# Patient Record
Sex: Male | Born: 1945 | Race: Black or African American | Hispanic: No | Marital: Married | Smoking: Former smoker
Health system: Southern US, Community
[De-identification: ages and names within clinical notes are randomized; demographics above are authoritative.]

## PROBLEM LIST (undated history)

## (undated) DIAGNOSIS — D649 Anemia, unspecified: Secondary | ICD-10-CM

## (undated) DIAGNOSIS — D509 Iron deficiency anemia, unspecified: Secondary | ICD-10-CM

## (undated) DIAGNOSIS — E1129 Type 2 diabetes mellitus with other diabetic kidney complication: Secondary | ICD-10-CM

## (undated) DIAGNOSIS — C801 Malignant (primary) neoplasm, unspecified: Secondary | ICD-10-CM

## (undated) DIAGNOSIS — M869 Osteomyelitis, unspecified: Secondary | ICD-10-CM

## (undated) DIAGNOSIS — R0602 Shortness of breath: Secondary | ICD-10-CM

## (undated) DIAGNOSIS — K759 Inflammatory liver disease, unspecified: Secondary | ICD-10-CM

## (undated) DIAGNOSIS — I739 Peripheral vascular disease, unspecified: Secondary | ICD-10-CM

## (undated) DIAGNOSIS — Z992 Dependence on renal dialysis: Secondary | ICD-10-CM

## (undated) DIAGNOSIS — B20 Human immunodeficiency virus [HIV] disease: Secondary | ICD-10-CM

## (undated) DIAGNOSIS — G709 Myoneural disorder, unspecified: Secondary | ICD-10-CM

## (undated) DIAGNOSIS — I10 Essential (primary) hypertension: Secondary | ICD-10-CM

## (undated) DIAGNOSIS — N186 End stage renal disease: Secondary | ICD-10-CM

## (undated) HISTORY — PX: HERNIA REPAIR: SHX51

## (undated) HISTORY — PX: HEMORROIDECTOMY: SUR656

## (undated) HISTORY — DX: Peripheral vascular disease, unspecified: I73.9

## (undated) HISTORY — PX: INSERTION OF DIALYSIS CATHETER: SHX1324

---

## 2006-01-28 ENCOUNTER — Emergency Department (HOSPITAL_COMMUNITY): Admission: EM | Admit: 2006-01-28 | Discharge: 2006-01-28 | Payer: Self-pay | Admitting: Emergency Medicine

## 2006-01-31 ENCOUNTER — Emergency Department (HOSPITAL_COMMUNITY): Admission: EM | Admit: 2006-01-31 | Discharge: 2006-01-31 | Payer: Self-pay | Admitting: Family Medicine

## 2006-12-29 ENCOUNTER — Emergency Department (HOSPITAL_COMMUNITY): Admission: EM | Admit: 2006-12-29 | Discharge: 2006-12-29 | Payer: Self-pay | Admitting: Emergency Medicine

## 2009-06-23 ENCOUNTER — Emergency Department (HOSPITAL_COMMUNITY): Admission: EM | Admit: 2009-06-23 | Discharge: 2009-06-23 | Payer: Self-pay | Admitting: Emergency Medicine

## 2009-08-20 ENCOUNTER — Emergency Department (HOSPITAL_COMMUNITY): Admission: EM | Admit: 2009-08-20 | Discharge: 2009-08-20 | Payer: Self-pay | Admitting: Emergency Medicine

## 2010-02-14 ENCOUNTER — Emergency Department (HOSPITAL_COMMUNITY): Admission: EM | Admit: 2010-02-14 | Discharge: 2010-02-14 | Payer: Self-pay | Admitting: Family Medicine

## 2010-02-14 ENCOUNTER — Inpatient Hospital Stay (HOSPITAL_COMMUNITY): Admission: EM | Admit: 2010-02-14 | Discharge: 2010-02-16 | Payer: Self-pay | Admitting: Emergency Medicine

## 2011-01-16 LAB — URINALYSIS, ROUTINE W REFLEX MICROSCOPIC
Bilirubin Urine: NEGATIVE
Glucose, UA: NEGATIVE mg/dL
Ketones, ur: NEGATIVE mg/dL

## 2011-01-16 LAB — POCT I-STAT, CHEM 8
Calcium, Ion: 1.2 mmol/L (ref 1.12–1.32)
Creatinine, Ser: 2.5 mg/dL — ABNORMAL HIGH (ref 0.4–1.5)
Glucose, Bld: 227 mg/dL — ABNORMAL HIGH (ref 70–99)
HCT: 34 % — ABNORMAL LOW (ref 39.0–52.0)
Hemoglobin: 11.6 g/dL — ABNORMAL LOW (ref 13.0–17.0)

## 2011-01-16 LAB — CBC
HCT: 29.8 % — ABNORMAL LOW (ref 39.0–52.0)
Hemoglobin: 10.3 g/dL — ABNORMAL LOW (ref 13.0–17.0)
MCV: 91.7 fL (ref 78.0–100.0)
MCV: 93.2 fL (ref 78.0–100.0)
Platelets: 109 10*3/uL — ABNORMAL LOW (ref 150–400)
Platelets: 115 10*3/uL — ABNORMAL LOW (ref 150–400)
RBC: 3.17 MIL/uL — ABNORMAL LOW (ref 4.22–5.81)
RDW: 14.8 % (ref 11.5–15.5)
WBC: 4.8 10*3/uL (ref 4.0–10.5)

## 2011-01-16 LAB — BASIC METABOLIC PANEL
BUN: 20 mg/dL (ref 6–23)
BUN: 25 mg/dL — ABNORMAL HIGH (ref 6–23)
CO2: 16 mEq/L — ABNORMAL LOW (ref 19–32)
Creatinine, Ser: 1.76 mg/dL — ABNORMAL HIGH (ref 0.4–1.5)
GFR calc non Af Amer: 34 mL/min — ABNORMAL LOW (ref >60)
GFR calc non Af Amer: 39 mL/min — ABNORMAL LOW (ref >60)
Glucose, Bld: 137 mg/dL — ABNORMAL HIGH (ref 70–99)
Potassium: 4.3 mEq/L (ref 3.5–5.1)

## 2011-01-16 LAB — GLUCOSE, CAPILLARY
Glucose-Capillary: 124 mg/dL — ABNORMAL HIGH (ref 70–99)
Glucose-Capillary: 128 mg/dL — ABNORMAL HIGH (ref 70–99)
Glucose-Capillary: 134 mg/dL — ABNORMAL HIGH (ref 70–99)
Glucose-Capillary: 140 mg/dL — ABNORMAL HIGH (ref 70–99)
Glucose-Capillary: 226 mg/dL — ABNORMAL HIGH (ref 70–99)
Glucose-Capillary: 228 mg/dL — ABNORMAL HIGH (ref 70–99)
Glucose-Capillary: 92 mg/dL (ref 70–99)

## 2011-01-16 LAB — MAGNESIUM: Magnesium: 2.3 mg/dL (ref 1.5–2.5)

## 2011-01-16 LAB — POCT CARDIAC MARKERS: Troponin i, poc: <0.05 ng/mL (ref 0.00–0.09)

## 2011-01-16 LAB — HEMOGLOBIN A1C: Hgb A1c MFr Bld: 6.8 % — ABNORMAL HIGH (ref <5.7)

## 2011-01-16 LAB — URINE MICROSCOPIC-ADD ON

## 2011-01-16 LAB — URINE CULTURE

## 2011-01-16 LAB — LIPID PANEL: Cholesterol: 170 mg/dL (ref 0–200)

## 2011-12-26 ENCOUNTER — Encounter (HOSPITAL_COMMUNITY)
Admission: RE | Admit: 2011-12-26 | Discharge: 2011-12-26 | Disposition: A | Discharge status: Home / Self Care | Payer: Non-veteran care | Source: Ambulatory Visit | Attending: Nephrology | Admitting: Nephrology

## 2011-12-26 DIAGNOSIS — D649 Anemia, unspecified: Secondary | ICD-10-CM | POA: Insufficient documentation

## 2011-12-26 LAB — ABO/RH: ABO/RH(D): O POS

## 2011-12-27 LAB — TYPE AND SCREEN
Antibody Screen: NEGATIVE
Unit division: 0

## 2012-01-03 ENCOUNTER — Other Ambulatory Visit (HOSPITAL_COMMUNITY): Payer: Self-pay | Admitting: Nephrology

## 2012-01-03 DIAGNOSIS — N186 End stage renal disease: Secondary | ICD-10-CM

## 2012-01-04 ENCOUNTER — Ambulatory Visit (HOSPITAL_COMMUNITY)
Admission: RE | Admit: 2012-01-04 | Discharge: 2012-01-04 | Disposition: A | Discharge status: Home / Self Care | Payer: Non-veteran care | Source: Ambulatory Visit | Attending: Nephrology | Admitting: Nephrology

## 2012-01-04 DIAGNOSIS — Z452 Encounter for adjustment and management of vascular access device: Secondary | ICD-10-CM | POA: Insufficient documentation

## 2012-01-04 DIAGNOSIS — N186 End stage renal disease: Secondary | ICD-10-CM | POA: Insufficient documentation

## 2012-01-04 MED ORDER — CHLORHEXIDINE GLUCONATE 4 % EX LIQD
CUTANEOUS | Status: AC
  Filled 2012-01-04: qty 30

## 2012-01-07 ENCOUNTER — Telehealth (HOSPITAL_COMMUNITY): Payer: Self-pay | Admitting: Radiology

## 2012-09-02 ENCOUNTER — Observation Stay (HOSPITAL_COMMUNITY): Payer: Medicare PPO

## 2012-09-02 ENCOUNTER — Encounter: Payer: Self-pay | Admitting: Internal Medicine

## 2012-09-02 ENCOUNTER — Inpatient Hospital Stay (HOSPITAL_COMMUNITY)
Admission: AD | Admit: 2012-09-02 | Discharge: 2012-09-05 | DRG: 977 | Disposition: A | Discharge status: Home Health | Payer: Medicare PPO | Source: Ambulatory Visit | Attending: Internal Medicine | Admitting: Internal Medicine

## 2012-09-02 DIAGNOSIS — F17200 Nicotine dependence, unspecified, uncomplicated: Secondary | ICD-10-CM | POA: Diagnosis present

## 2012-09-02 DIAGNOSIS — N058 Unspecified nephritic syndrome with other morphologic changes: Secondary | ICD-10-CM | POA: Diagnosis present

## 2012-09-02 DIAGNOSIS — B20 Human immunodeficiency virus [HIV] disease: Principal | ICD-10-CM | POA: Diagnosis present

## 2012-09-02 DIAGNOSIS — E1129 Type 2 diabetes mellitus with other diabetic kidney complication: Secondary | ICD-10-CM | POA: Diagnosis present

## 2012-09-02 DIAGNOSIS — Z885 Allergy status to narcotic agent status: Secondary | ICD-10-CM

## 2012-09-02 DIAGNOSIS — I10 Essential (primary) hypertension: Secondary | ICD-10-CM | POA: Diagnosis present

## 2012-09-02 DIAGNOSIS — D649 Anemia, unspecified: Secondary | ICD-10-CM

## 2012-09-02 DIAGNOSIS — Z9221 Personal history of antineoplastic chemotherapy: Secondary | ICD-10-CM

## 2012-09-02 DIAGNOSIS — Z9181 History of falling: Secondary | ICD-10-CM

## 2012-09-02 DIAGNOSIS — Z8619 Personal history of other infectious and parasitic diseases: Secondary | ICD-10-CM

## 2012-09-02 DIAGNOSIS — C801 Malignant (primary) neoplasm, unspecified: Secondary | ICD-10-CM | POA: Insufficient documentation

## 2012-09-02 DIAGNOSIS — N186 End stage renal disease: Secondary | ICD-10-CM | POA: Diagnosis present

## 2012-09-02 DIAGNOSIS — M5430 Sciatica, unspecified side: Secondary | ICD-10-CM | POA: Diagnosis present

## 2012-09-02 DIAGNOSIS — Z8249 Family history of ischemic heart disease and other diseases of the circulatory system: Secondary | ICD-10-CM

## 2012-09-02 DIAGNOSIS — K759 Inflammatory liver disease, unspecified: Secondary | ICD-10-CM | POA: Insufficient documentation

## 2012-09-02 DIAGNOSIS — I120 Hypertensive chronic kidney disease with stage 5 chronic kidney disease or end stage renal disease: Secondary | ICD-10-CM | POA: Diagnosis present

## 2012-09-02 DIAGNOSIS — D509 Iron deficiency anemia, unspecified: Secondary | ICD-10-CM | POA: Diagnosis present

## 2012-09-02 DIAGNOSIS — D631 Anemia in chronic kidney disease: Secondary | ICD-10-CM | POA: Diagnosis present

## 2012-09-02 DIAGNOSIS — Z85048 Personal history of other malignant neoplasm of rectum, rectosigmoid junction, and anus: Secondary | ICD-10-CM

## 2012-09-02 DIAGNOSIS — Z992 Dependence on renal dialysis: Secondary | ICD-10-CM

## 2012-09-02 HISTORY — DX: Anemia, unspecified: D64.9

## 2012-09-02 HISTORY — DX: Type 2 diabetes mellitus with other diabetic kidney complication: E11.29

## 2012-09-02 HISTORY — DX: Human immunodeficiency virus (HIV) disease: B20

## 2012-09-02 HISTORY — DX: Malignant (primary) neoplasm, unspecified: C80.1

## 2012-09-02 HISTORY — DX: Dependence on renal dialysis: Z99.2

## 2012-09-02 HISTORY — DX: Inflammatory liver disease, unspecified: K75.9

## 2012-09-02 HISTORY — DX: End stage renal disease: N18.6

## 2012-09-02 HISTORY — DX: Shortness of breath: R06.02

## 2012-09-02 HISTORY — DX: Iron deficiency anemia, unspecified: D50.9

## 2012-09-02 HISTORY — DX: Myoneural disorder, unspecified: G70.9

## 2012-09-02 HISTORY — DX: Essential (primary) hypertension: I10

## 2012-09-02 LAB — CBC
HCT: 13.6 % — ABNORMAL LOW (ref 39.0–52.0)
Platelets: 133 10*3/uL — ABNORMAL LOW (ref 150–400)
RDW: 15.4 % (ref 11.5–15.5)
WBC: 5 10*3/uL (ref 4.0–10.5)

## 2012-09-02 LAB — GLUCOSE, CAPILLARY
Glucose-Capillary: 249 mg/dL — ABNORMAL HIGH (ref 70–99)
Glucose-Capillary: 367 mg/dL — ABNORMAL HIGH (ref 70–99)

## 2012-09-02 LAB — BASIC METABOLIC PANEL
GFR calc Af Amer: 4 mL/min — ABNORMAL LOW (ref >90)
GFR calc non Af Amer: 4 mL/min — ABNORMAL LOW (ref >90)
Potassium: 5.1 mEq/L (ref 3.5–5.1)
Sodium: 134 mEq/L — ABNORMAL LOW (ref 135–145)

## 2012-09-02 LAB — RETICULOCYTES: Retic Count, Absolute: 61.7 10*3/uL (ref 19.0–186.0)

## 2012-09-02 MED ORDER — ONDANSETRON HCL 4 MG/2ML IJ SOLN: 4.0000 mg | Freq: Four times a day (QID) | INTRAMUSCULAR | Status: DC | PRN

## 2012-09-02 MED ORDER — DELFLEX-LC/1.5% DEXTROSE 346 MOSM/L IP SOLN
INTRAPERITONEAL | Status: DC
  Administered 2012-09-02: 5 L via INTRAPERITONEAL
  Administered 2012-09-04: 5000 mL via INTRAPERITONEAL

## 2012-09-02 MED ORDER — SODIUM CHLORIDE 0.9 % IV SOLN
250.0000 mL | INTRAVENOUS | Status: DC | PRN
Start: 2012-09-02 — End: 2012-09-05

## 2012-09-02 MED ORDER — ONDANSETRON HCL 4 MG PO TABS: 4.0000 mg | ORAL_TABLET | Freq: Four times a day (QID) | ORAL | Status: DC | PRN

## 2012-09-02 MED ORDER — ACETAMINOPHEN 325 MG PO TABS
650.0000 mg | ORAL_TABLET | Freq: Four times a day (QID) | ORAL | Status: DC | PRN
  Administered 2012-09-03 (×2): 650 mg via ORAL
  Filled 2012-09-02 (×3): qty 2

## 2012-09-02 MED ORDER — INSULIN ASPART 100 UNIT/ML ~~LOC~~ SOLN
0.0000 [IU] | Freq: Every day | SUBCUTANEOUS | Status: DC
  Administered 2012-09-02 – 2012-09-04 (×3): 2 [IU] via SUBCUTANEOUS

## 2012-09-02 MED ORDER — SODIUM CHLORIDE 0.9 % IJ SOLN: 3.0000 mL | INTRAMUSCULAR | Status: DC | PRN

## 2012-09-02 MED ORDER — ENOXAPARIN SODIUM 30 MG/0.3ML ~~LOC~~ SOLN
30.0000 mg | Freq: Every day | SUBCUTANEOUS | Status: DC
  Administered 2012-09-02 – 2012-09-04 (×3): 30 mg via SUBCUTANEOUS
  Filled 2012-09-02 (×4): qty 0.3

## 2012-09-02 MED ORDER — SODIUM CHLORIDE 0.9 % IJ SOLN
3.0000 mL | Freq: Two times a day (BID) | INTRAMUSCULAR | Status: DC
  Administered 2012-09-02 – 2012-09-04 (×5): 3 mL via INTRAVENOUS

## 2012-09-02 MED ORDER — BISACODYL 10 MG RE SUPP: 10.0000 mg | Freq: Every day | RECTAL | Status: DC | PRN

## 2012-09-02 MED ORDER — ACETAMINOPHEN 650 MG RE SUPP: 650.0000 mg | Freq: Four times a day (QID) | RECTAL | Status: DC | PRN

## 2012-09-02 MED ORDER — INSULIN ASPART 100 UNIT/ML ~~LOC~~ SOLN
0.0000 [IU] | Freq: Three times a day (TID) | SUBCUTANEOUS | Status: DC
  Administered 2012-09-02: 15 [IU] via SUBCUTANEOUS
  Administered 2012-09-03: 3 [IU] via SUBCUTANEOUS
  Administered 2012-09-03: 8 [IU] via SUBCUTANEOUS
  Administered 2012-09-04 (×2): 5 [IU] via SUBCUTANEOUS
  Administered 2012-09-04 – 2012-09-05 (×3): 3 [IU] via SUBCUTANEOUS
  Administered 2012-09-05: 8 [IU] via SUBCUTANEOUS

## 2012-09-02 MED ORDER — PANTOPRAZOLE SODIUM 40 MG PO TBEC
40.0000 mg | DELAYED_RELEASE_TABLET | Freq: Every day | ORAL | Status: DC
  Administered 2012-09-02 – 2012-09-04 (×3): 40 mg via ORAL
  Filled 2012-09-02 (×3): qty 1

## 2012-09-02 MED ORDER — SODIUM CHLORIDE 0.9 % IJ SOLN: 3.0000 mL | Freq: Two times a day (BID) | INTRAMUSCULAR | Status: DC

## 2012-09-02 MED ORDER — DELFLEX-LC/2.5% DEXTROSE 394 MOSM/L IP SOLN
INTRAPERITONEAL | Status: DC
  Administered 2012-09-04: 5000 mL via INTRAPERITONEAL

## 2012-09-02 NOTE — Consult Note (Signed)
Referring Provider: No ref. provider found Primary Care Physician:  Evlyn Courier, MD Primary Nephrologist:  [facility]  Reason for Consultation: medical management of ESRD   HPI:66 year old male with a history of disease on peritoneal dialysis, diabetes mellitus type 2 and HIV. He was presented yesterday to Dr. Adaline Sill office with fatigue, dizziness and loss of energy. Patient was evaluated by blood work which was reported today. His hemoglobin is 4.8 and his hematocrit is 15   Past Medical History  Diagnosis Date  . ESRD on peritoneal dialysis   . HIV disease   . Hypertension   . Shortness of breath   . DM type 2 causing renal disease   . Neuromuscular disorder     tremers  . Cancer     hx of rectal cancer  . Anemia 09/02/2012  . Hepatitis     hx of hepatitis B    Past Surgical History  Procedure Date  . Hemorroidectomy   . Insertion of dialysis catheter   . Hernia repair     TESTICLE    Prior to Admission medications   Not on File    Current Facility-Administered Medications  Medication Dose Route Frequency Provider Last Rate Last Dose  . 0.9 %  sodium chloride infusion  250 mL Intravenous PRN Kela Millin, MD      . acetaminophen (TYLENOL) tablet 650 mg  650 mg Oral Q6H PRN Kela Millin, MD       Or  . acetaminophen (TYLENOL) suppository 650 mg  650 mg Rectal Q6H PRN Adeline C Viyuoh, MD      . bisacodyl (DULCOLAX) suppository 10 mg  10 mg Rectal Daily PRN Kela Millin, MD      . dialysis solution 1.5% low-MG/low-CA (DELFLEX) CAPD   Intraperitoneal Continuous Garnetta Buddy, MD      . dialysis solution 2.5% low-MG/low-CA (DELFLEX) CAPD   Intraperitoneal Continuous Garnetta Buddy, MD      . enoxaparin (LOVENOX) injection 30 mg  30 mg Subcutaneous q1800 Adeline C Viyuoh, MD      . insulin aspart (novoLOG) injection 0-15 Units  0-15 Units Subcutaneous TID WC Adeline C Viyuoh, MD      . insulin aspart (novoLOG) injection 0-5 Units  0-5 Units Subcutaneous QHS  Adeline C Viyuoh, MD      . ondansetron (ZOFRAN) tablet 4 mg  4 mg Oral Q6H PRN Adeline C Viyuoh, MD       Or  . ondansetron (ZOFRAN) injection 4 mg  4 mg Intravenous Q6H PRN Adeline C Viyuoh, MD      . pantoprazole (PROTONIX) EC tablet 40 mg  40 mg Oral Daily Adeline C Viyuoh, MD      . sodium chloride 0.9 % injection 3 mL  3 mL Intravenous Q12H Adeline C Viyuoh, MD      . sodium chloride 0.9 % injection 3 mL  3 mL Intravenous Q12H Adeline C Viyuoh, MD      . sodium chloride 0.9 % injection 3 mL  3 mL Intravenous PRN Kela Millin, MD        Allergies as of 09/02/2012 - Review Complete 09/02/2012  Allergen Reaction Noted  . Codeine Nausea Only 12/26/2011    History reviewed. No pertinent family history.  History   Social History  . Marital Status: Married    Spouse Name: N/A    Number of Children: N/A  . Years of Education: N/A   Occupational History  . Not on  file.   Social History Main Topics  . Smoking status: Current Every Day Smoker -- 0.2 packs/day for 50 years    Types: Cigarettes  . Smokeless tobacco: Never Used  . Alcohol Use: No  . Drug Use: No  . Sexually Active:    Other Topics Concern  . Not on file   Social History Narrative  . No narrative on file    Review of Systems: Gen: weak and tired HEENT: No visual complaints, No history of Retinopathy. Normal external appearance No Epistaxis or Sore throat. No sinusitis.   CV:no angina but short of breath on exercise RS no cough, sputum, wheezing, coughing up blood, and pleurisy. GI: No black tarry stools  GU : ESRD MS: Denies joint pain, limitation of movement, and swelling, stiffness, low back pain, extremity pain. Denies muscle weakness, cramps, atrophy.  No use of non steroidal antiinflammatory drugs. Derm: Denies rash, itching, dry skin, hives, moles, warts, or unhealing ulcers.  Psych: Denies depression, anxiety, memory loss, suicidal ideation, hallucinations, paranoia, and confusion. Heme:  Profound anemia Neuro: No headache.  No diplopia. No dysarthria.  No dysphasia.  No history of CVA.  No Seizures. No paresthesias.  No weakness. Endocrine No DM.  No Thyroid disease.  No Adrenal disease.  Physical Exam: Vital signs in last 24 hours: Temp:  [98 F (36.7 C)] 98 F (36.7 C) (11/05 1710) Pulse Rate:  [76-79] 76  (11/05 1710) Resp:  [20] 20  (11/05 1710) BP: (115-120)/(47-58) 115/58 mmHg (11/05 1710) SpO2:  [98 %] 98 % (11/05 1710) Weight:  [98.3 kg (216 lb 11.4 oz)] 98.3 kg (216 lb 11.4 oz) (11/05 1211)   General:   Chronically Ill Head:  Normocephalic and atraumatic. Eyes:  Sclera clear, no icterus.   Conjunctiva pale Ears:  Normal auditory acuity. Nose:  No deformity, discharge,  or lesions. Mouth:  No deformity or lesions, dentition normal. Neck:  Supple; no masses or thyromegaly. JVP not elevated Lungs:  Clear throughout to auscultation.   No wheezes, crackles, or rhonchi. No acute distress. Heart:  Regular rate and rhythm; no murmurs, clicks, rubs,  or gallops. Abdomen:  Soft, nontender and nondistended. No masses, hepatosplenomegaly or hernias noted. Normal bowel sounds, without guarding, and without rebound.  PD catheter clean and dry dressing Msk:  Symmetrical without gross deformities. Normal posture. Pulses:  No carotid, renal, femoral bruits. DP and PT symmetrical and equal Extremities:  Without clubbing or edema. Neurologic:  Alert and  oriented x4;  grossly normal neurologically. Skin:  Intact without significant lesions or rashes. Cervical Nodes:  No significant cervical adenopathy. Psych:  Alert and cooperative. Normal mood and affect.  Intake/Output from previous day:   Intake/Output this shift: Total I/O In: 490 [P.O.:240; I.V.:250] Out: -   Lab Results:  Basename 09/02/12 1518  WBC 5.0  HGB 4.2*  HCT 13.6*  PLT 133*   BMET  Basename 09/02/12 1518  NA 134*  K 5.1  CL 97  CO2 25  GLUCOSE 433*  BUN 88*  CREATININE 12.27*  CALCIUM  8.2*  PHOS --   LFT No results found for this basename: PROT,ALBUMIN,AST,ALT,ALKPHOS,BILITOT,BILIDIR,IBILI in the last 72 hours PT/INR No results found for this basename: LABPROT:2,INR:2 in the last 72 hours Hepatitis Panel No results found for this basename: HEPBSAG,HCVAB,HEPAIGM,HEPBIGM in the last 72 hours  Studies/Results: Portable Chest 1 View  09/02/2012  *RADIOLOGY REPORT*  Clinical Data: Cough and shortness of breath.  PORTABLE CHEST - 1 VIEW  Comparison: 02/14/2010  Findings: Heart  size and pulmonary vascularity are normal.  There is slight accentuation of the interstitial markings in the left perihilar region without a consolidative infiltrate or effusion.  Right lung is clear.  No acute osseous abnormality.  Previous resection of the distal clavicle.  Moderate arthritis the left AC joint.  IMPRESSION: Bronchitic changes of the left perihilar region which may be chronic.   Original Report Authenticated By: Francene Boyers, M.D.     Assessment/Plan:  ESRD-continue peritoneal dialysis cycler  ANEMIA-profound anemia   work up.per primary no obvious blood loss  MBD-follow calcium and phosphorus and adjust binders  HTN/VOL appears euvolemic at present with maintained blood pressure  ACCESS-fistula left and PD catheter  Will follow patient and get records from [facility]   LOS: 0 Earlie Schank W @TODAY @5 :15 PM

## 2012-09-02 NOTE — Progress Notes (Signed)
PENDING ACCEPTANCE  NOTE:  Call received from:    Mirna Mires, MD  REASON FOR REQUESTING TRANSFER:    Severe anemia  HPI:   66 year old male with a history of disease on peritoneal dialysis, diabetes mellitus type 2 and HIV. He was presented yesterday to Dr. Adaline Sill office with fatigue, dizziness and loss of energy. Patient was evaluated by blood work which was reported today. His hemoglobin is 4.8 and his hematocrit is 15. Per Dr. Laroy Apple when patient left yesterday he was walking without any problems, his blood pressure was stable so patient will be admitted to telemetry as directed med.   PLAN:  According to telephone report, this patient was accepted for telemetry,   Under [facility] team:  MC 10,  I have requested an order be written to call Flow Manager at [phone] upon patient arrival to the floor for final physician assignment who will do the admission and give admitting orders.  SIGNED: Clint Lipps, MD [facility]  09/02/2012, 11:03 AM

## 2012-09-02 NOTE — Progress Notes (Addendum)
md aware of patient's arrival to the unit as direct admit and gave an order to place on tele - pt directed to stay in the bed, vss and oriented to unit.  Continue to monitor.

## 2012-09-02 NOTE — Progress Notes (Signed)
Late entry: Patient arrived to the floor by wheelchair.  Patient is alert and oriented.  Patient states he is a peritoneal dialysis patient.  Patient is aware that his hemoglobin is low.  Patient has been educated on fall precautions and to call when he needs assistance.  Vital signs stable at this time.  IV team attempting IV placement at this time.  Admitting MD notified of patient's arrival to floor.  Waiting on orders to be placed.  Will monitor closely.

## 2012-09-03 DIAGNOSIS — N058 Unspecified nephritic syndrome with other morphologic changes: Secondary | ICD-10-CM

## 2012-09-03 DIAGNOSIS — B20 Human immunodeficiency virus [HIV] disease: Principal | ICD-10-CM

## 2012-09-03 DIAGNOSIS — E1129 Type 2 diabetes mellitus with other diabetic kidney complication: Secondary | ICD-10-CM

## 2012-09-03 DIAGNOSIS — N186 End stage renal disease: Secondary | ICD-10-CM

## 2012-09-03 DIAGNOSIS — D649 Anemia, unspecified: Secondary | ICD-10-CM

## 2012-09-03 LAB — HEMOGLOBIN A1C: Mean Plasma Glucose: 174 mg/dL — ABNORMAL HIGH (ref <117)

## 2012-09-03 LAB — COMPREHENSIVE METABOLIC PANEL
Alkaline Phosphatase: 108 U/L (ref 39–117)
BUN: 80 mg/dL — ABNORMAL HIGH (ref 6–23)
CO2: 24 mEq/L (ref 19–32)
Chloride: 94 mEq/L — ABNORMAL LOW (ref 96–112)
Creatinine, Ser: 11.09 mg/dL — ABNORMAL HIGH (ref 0.50–1.35)
GFR calc Af Amer: 5 mL/min — ABNORMAL LOW (ref >90)
GFR calc non Af Amer: 4 mL/min — ABNORMAL LOW (ref >90)
Glucose, Bld: 289 mg/dL — ABNORMAL HIGH (ref 70–99)
Potassium: 4.3 mEq/L (ref 3.5–5.1)
Total Bilirubin: 0.4 mg/dL (ref 0.3–1.2)

## 2012-09-03 LAB — VITAMIN B12: Vitamin B-12: 1243 pg/mL — ABNORMAL HIGH (ref 211–911)

## 2012-09-03 LAB — GLUCOSE, CAPILLARY
Glucose-Capillary: 163 mg/dL — ABNORMAL HIGH (ref 70–99)
Glucose-Capillary: 287 mg/dL — ABNORMAL HIGH (ref 70–99)
Glucose-Capillary: 237 mg/dL — ABNORMAL HIGH (ref 70–99)

## 2012-09-03 LAB — CBC
MCH: 28.9 pg (ref 26.0–34.0)
MCHC: 33.2 g/dL (ref 30.0–36.0)
Platelets: 133 10*3/uL — ABNORMAL LOW (ref 150–400)
RBC: 2.32 MIL/uL — ABNORMAL LOW (ref 4.22–5.81)
RDW: 15.2 % (ref 11.5–15.5)

## 2012-09-03 LAB — IRON AND TIBC: Iron: <10 ug/dL — ABNORMAL LOW (ref 42–135)

## 2012-09-03 MED ORDER — LATANOPROST 0.005 % OP SOLN
1.0000 [drp] | Freq: Every day | OPHTHALMIC | Status: DC
  Administered 2012-09-04 (×2): 1 [drp] via OPHTHALMIC
  Filled 2012-09-03 (×2): qty 2.5

## 2012-09-03 MED ORDER — LORATADINE 10 MG PO TABS
10.0000 mg | ORAL_TABLET | Freq: Every day | ORAL | Status: DC
  Filled 2012-09-03: qty 1

## 2012-09-03 MED ORDER — INSULIN GLARGINE 100 UNIT/ML ~~LOC~~ SOLN
14.0000 [IU] | SUBCUTANEOUS | Status: DC
Start: 2012-09-04 — End: 2012-09-04
  Administered 2012-09-04: 14 [IU] via SUBCUTANEOUS

## 2012-09-03 MED ORDER — ACETAMINOPHEN 325 MG PO TABS
650.0000 mg | ORAL_TABLET | Freq: Once | ORAL | Status: AC
  Administered 2012-09-03: 650 mg via ORAL

## 2012-09-03 MED ORDER — CARVEDILOL 3.125 MG PO TABS
3.1250 mg | ORAL_TABLET | Freq: Two times a day (BID) | ORAL | Status: DC
  Administered 2012-09-03 – 2012-09-05 (×5): 3.125 mg via ORAL
  Filled 2012-09-03 (×6): qty 1

## 2012-09-03 MED ORDER — METHOCARBAMOL 750 MG PO TABS
750.0000 mg | ORAL_TABLET | Freq: Four times a day (QID) | ORAL | Status: DC
  Filled 2012-09-03 (×3): qty 1

## 2012-09-03 MED ORDER — CALCIUM ACETATE 667 MG PO CAPS
2668.0000 mg | ORAL_CAPSULE | Freq: Three times a day (TID) | ORAL | Status: DC
  Administered 2012-09-04 – 2012-09-05 (×6): 2668 mg via ORAL
  Filled 2012-09-03 (×7): qty 4

## 2012-09-03 MED ORDER — FUROSEMIDE 40 MG PO TABS
40.0000 mg | ORAL_TABLET | Freq: Every day | ORAL | Status: DC
  Administered 2012-09-03 – 2012-09-05 (×3): 40 mg via ORAL
  Filled 2012-09-03 (×3): qty 1

## 2012-09-03 MED ORDER — SIMVASTATIN 10 MG PO TABS
10.0000 mg | ORAL_TABLET | Freq: Every day | ORAL | Status: DC
  Administered 2012-09-03: 10 mg via ORAL
  Filled 2012-09-03 (×2): qty 1

## 2012-09-03 MED ORDER — POLYVINYL ALCOHOL 1.4 % OP SOLN
1.0000 [drp] | Freq: Four times a day (QID) | OPHTHALMIC | Status: DC
  Filled 2012-09-03: qty 15

## 2012-09-03 MED ORDER — TENOFOVIR DISOPROXIL FUMARATE 300 MG PO TABS: 300.0000 mg | ORAL_TABLET | ORAL | Status: DC

## 2012-09-03 MED ORDER — SIMETHICONE 80 MG PO CHEW
160.0000 mg | CHEWABLE_TABLET | Freq: Three times a day (TID) | ORAL | Status: DC | PRN
  Filled 2012-09-03: qty 2

## 2012-09-03 MED ORDER — DIPHENHYDRAMINE HCL 25 MG PO CAPS
25.0000 mg | ORAL_CAPSULE | Freq: Once | ORAL | Status: AC
  Administered 2012-09-03: 25 mg via ORAL
  Filled 2012-09-03: qty 1

## 2012-09-03 MED ORDER — ACETAMINOPHEN 325 MG PO TABS: 650.0000 mg | ORAL_TABLET | Freq: Two times a day (BID) | ORAL | Status: DC | PRN

## 2012-09-03 MED ORDER — METOCLOPRAMIDE HCL 5 MG PO TABS
5.0000 mg | ORAL_TABLET | Freq: Three times a day (TID) | ORAL | Status: DC
  Administered 2012-09-04 – 2012-09-05 (×6): 5 mg via ORAL
  Filled 2012-09-03 (×8): qty 1

## 2012-09-03 MED ORDER — DARBEPOETIN ALFA-POLYSORBATE 100 MCG/0.5ML IJ SOLN
100.0000 ug | INTRAMUSCULAR | Status: DC
  Administered 2012-09-04: 100 ug via SUBCUTANEOUS
  Filled 2012-09-03 (×2): qty 0.5

## 2012-09-03 MED ORDER — SEVELAMER CARBONATE 800 MG PO TABS
800.0000 mg | ORAL_TABLET | Freq: Three times a day (TID) | ORAL | Status: DC
  Administered 2012-09-03 – 2012-09-05 (×7): 800 mg via ORAL
  Filled 2012-09-03 (×8): qty 1

## 2012-09-03 MED ORDER — GABAPENTIN 100 MG PO CAPS
100.0000 mg | ORAL_CAPSULE | Freq: Every day | ORAL | Status: DC
  Administered 2012-09-03 – 2012-09-05 (×3): 100 mg via ORAL
  Filled 2012-09-03 (×3): qty 1

## 2012-09-03 MED ORDER — INSULIN ASPART 100 UNIT/ML ~~LOC~~ SOLN
5.0000 [IU] | Freq: Three times a day (TID) | SUBCUTANEOUS | Status: DC
  Administered 2012-09-03 – 2012-09-05 (×5): 5 [IU] via SUBCUTANEOUS
  Administered 2012-09-05: 18:00:00 via SUBCUTANEOUS
  Administered 2012-09-05: 5 [IU] via SUBCUTANEOUS

## 2012-09-03 MED ORDER — CALCIUM ACETATE 667 MG PO CAPS
667.0000 mg | ORAL_CAPSULE | Freq: Three times a day (TID) | ORAL | Status: DC
  Administered 2012-09-03: 667 mg via ORAL
  Filled 2012-09-03 (×2): qty 1

## 2012-09-03 NOTE — Progress Notes (Signed)
Inpatient Diabetes Program Recommendations  AACE/ADA: New Consensus Statement on Inpatient Glycemic Control (2013)  Target Ranges:  Prepandial:   less than 140 mg/dL      Peak postprandial:   less than 180 mg/dL (1-2 hours)      Critically ill patients:  140 - 180 mg/dL  Results for Benjamin Long, Benjamin Long (MRN [id number]) as of 09/03/2012 14:16  Ref. Range 09/03/2012 07:41 09/03/2012 11:42  Glucose-Capillary Latest Range: 70-99 mg/dL 865 (H) 784 (H)    Inpatient Diabetes Program Recommendations Insulin - Basal: Add Lantus 14 units (1/2 home dose) Insulin - Meal Coverage: Add Novolog 5 units TID with meals per Glycemic Control Order Set (should not be given if patient eats <50%) Thank you  Piedad Climes RN,BSN,CDE Inpatient Diabetes Coordinator [phone] (team pager)

## 2012-09-03 NOTE — H&P (Signed)
ADMISSION DATE: 09/02/12 AT 1:00PM  [facility] History and Physical  Filmore Molyneux ZOX:[id number] DOB: [birth year] DOA: 09/02/2012  Referring physician: Dr Loleta Chance PCP: Evlyn Courier, MD  Specialists:   Chief Complaint: dizziness  HPI: Benjamin Long is a 66 y.o. male with history of ESRD on peritoneal dialysis per renal at [facility], HIV, Anal cancer-s/p chemo a year ago, diabetes mellitus type 2  and and HTN who presents with the above complaints. He states he was seen on the day prior to admission at Dr [facility] with complaints of fatigue, dizziness and loss of energy and  lab work was done and came back today with hemoglobin is 4.8 and his hematocrit is 15. [facility] was called for direct admission for further eval and management. He states that he has had this problem in the past and required transfusion 2 times in the past year- last in Feb or March of this year. He denies chest pain, SOB, hemoptysis, N/V, hematemesis, melena and no hematochezia. He admits to fall about I week ago, denies loss of consciousness.    Review of Systems: The patient denies anorexia, fever, weight loss,, vision loss, decreased hearing, hoarseness, chest pain, syncope, peripheral edema, balance deficits, hemoptysis, abdominal pain, melena, hematochezia, severe indigestion/heartburn, hematuria, incontinence, transient blindness, difficulty walking, depression, unusual weight change, abnormal bleeding.  Past Medical History  Diagnosis Date  . ESRD on peritoneal dialysis   . HIV disease   . Hypertension   . Shortness of breath   . DM type 2 causing renal disease   . Neuromuscular disorder     tremers  . Cancer     hx of rectal cancer  . Anemia 09/02/2012  . Hepatitis     hx of hepatitis B   Past Surgical History  Procedure Date  . Hemorroidectomy   . Insertion of dialysis catheter   . Hernia repair     TESTICLE   Social History:  reports that he has been smoking Cigarettes.  He has a  12.5 pack-year smoking history. He has never used smokeless tobacco. He reports that he does not drink alcohol or use illicit drugs. 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  where does patient live--home    Allergies  Allergen Reactions  . Codeine Nausea Only    History reviewed. No pertinent family history. mother had DM and CA- died age 81, Father had CAD, died age 50  Prior to Admission medications   Not on File   Physical Exam: Filed Vitals:   09/02/12 2224 09/02/12 2328 09/02/12 2352 09/03/12 0051  BP: 114/65 112/68 112/66 95/60  Pulse: 78 76 74 71  Temp: 97.1 F (36.2 C) 98.2 F (36.8 C) 97.8 F (36.6 C) 97.3 F (36.3 C)  TempSrc: Oral Oral Oral Oral  Resp: 18 16 16 20   Height:      Weight:      SpO2:       Constitutional: Vital signs reviewed.  Patient is a well-developed and well-nourished in no acute distress and cooperative with exam. Alert and oriented x3.  Head: Normocephalic and atraumatic Mouth: slightly dry MM Eyes: PERRL, EOMI, conjunctivae pale, No scleral icterus.  Neck: Supple, Trachea midline normal ROM, No JVD, mass, thyromegaly, or carotid bruit present.  Cardiovascular: RRR, S1 normal, S2 normal, no MRG, pulses symmetric and intact bilaterally Pulmonary/Chest: CTAB, no wheezes, rales, or rhonchi Abdominal: Soft. Non-tender, non-distended, bowel sounds are normal, no masses, organomegaly, or guarding present.  GU: no CVA tenderness Extremities:no cyanosis,no edema  Neurological: A&O x3, Strength is normal and symmetric bilaterally, cranial nerve II-XII are grossly intact, no focal motor deficit, sensory intact to light touch bilaterally.  Skin: Warm, dry and intact. No rash, cyanosis, or clubbing.  Psychiatric: Normal mood and affect. speech and behavior is normal. Judgment and thought content normal. Cognition and memory are normal.     Labs on Admission:  Basic Metabolic Panel:  Lab 09/02/12 4540  NA 134*  K 5.1  CL 97  CO2 25  GLUCOSE 433*  BUN  88*  CREATININE 12.27*  CALCIUM 8.2*  MG --  PHOS --   Liver Function Tests: No results found for this basename: AST:5,ALT:5,ALKPHOS:5,BILITOT:5,PROT:5,ALBUMIN:5 in the last 168 hours No results found for this basename: LIPASE:5,AMYLASE:5 in the last 168 hours No results found for this basename: AMMONIA:5 in the last 168 hours CBC:  Lab 09/02/12 1518  WBC 5.0  NEUTROABS --  HGB 4.2*  HCT 13.6*  MCV 92.5  PLT 133*   Cardiac Enzymes: No results found for this basename: CKTOTAL:5,CKMB:5,CKMBINDEX:5,TROPONINI:5 in the last 168 hours  BNP (last 3 results) No results found for this basename: PROBNP:3 in the last 8760 hours CBG:  Lab 09/02/12 2151 09/02/12 1628 09/02/12 1202  GLUCAP 241* 367* 249*    Radiological Exams on Admission: Portable Chest 1 View  09/02/2012  *RADIOLOGY REPORT*  Clinical Data: Cough and shortness of breath.  PORTABLE CHEST - 1 VIEW  Comparison: 02/14/2010  Findings: Heart size and pulmonary vascularity are normal.  There is slight accentuation of the interstitial markings in the left perihilar region without a consolidative infiltrate or effusion.  Right lung is clear.  No acute osseous abnormality.  Previous resection of the distal clavicle.  Moderate arthritis the left AC joint.  IMPRESSION: Bronchitic changes of the left perihilar region which may be chronic.   Original Report Authenticated By: Francene Boyers, M.D.       Assessment/Plan Principal Problem:  *Anemia, severe - As discussed above, unclear source - obtain anemia panel, He states has been transfused 2x in the past year and has seen hematology at [facility] -type cross and transfuse 3u PRBC - he has h/o anal ca(last chemo about a year ago) also his HIV and ESRD are possible causes - will obtain stool guaics follow and consult GI vs heme in am pending studies and evolution of clinical course. Active Problems:  ESRD on peritoneal dialysis -renal consulted - Dr Hyman Hopes to follow and assist with  dialysis  HIV disease - he states he is followed at [facility] ID clinic -follow resume outpt meds once obtained per med rec  Hypertension - monitor and treat accordingly  DM type 2 causing renal disease -accuchecks, SSI. Follow and resume outpt meds S/P fall/weakness - likely secondary to #1 - see above for treatment -PT/OT cons  -Out pt meds to be reconciled once med rec completed per pharmacy    Code Status: full Family Communication: directly with pt at bedside  Disposition Plan: admit to tele  Time spent: >48mins  Kela Millin [facility] Pager [phone]  If 7PM-7AM, please contact night-coverage [email or web address] Password [email or web address] 09/03/2012, 1:47 AM

## 2012-09-03 NOTE — Procedures (Signed)
I have seen and examined this patient and agree with the plan of care. Patient seen on dialysis and is comfortable.  Benjamin Long W 09/03/2012, 9:36 AM

## 2012-09-03 NOTE — Progress Notes (Signed)
Utilization review completed.  

## 2012-09-03 NOTE — Progress Notes (Signed)
OT Cancellation Note  Patient Details Name: Benjamin Long MRN: [id number] DOB: 1946/06/02   Cancelled Treatment:    Reason Eval/Treat Not Completed: Medical issues which prohibited therapy;Other (comment) (low Hgb. receiving blood)  [facility], OTR/L  [phone] 09/03/2012 09/03/2012, 12:18 PM

## 2012-09-03 NOTE — Progress Notes (Signed)
Late Entry: During scheduled medication administration this afternoon patient stated that a number of his scheduled medications were not medications that he normally takes at home (including; Robaxin, Neurontin, Lasix, and Arinesp).  Notified ordering M.D., Dr. Isla Pence, of medication discrepancies. Per M.D., pharmacy was contacted, medication reconciliation with pharmacist was requested. Patient's family will bring in patient's medication list this evening and medications will be reviewed with pharmacist. Will notify night nurse of pending medication reconciliation.

## 2012-09-03 NOTE — Progress Notes (Addendum)
Occupational Therapy Evaluation Patient Details Name: Benjamin Long MRN: [id number] DOB: Oct 14, 1946 Today's Date: 09/03/2012 Time: 8295-6213 OT Time Calculation (min): 24 min  OT Assessment / Plan / Recommendation Clinical Impression  66 yo with hx of HIV and Hep B, ESRD on dialysis, anal Ca. Pt with c/o dizziness and not feeling well on admission. Hgb 4.8  PTA, pt lives with partner who works during the day and was independent with all ADL and functional mobility for ADL. Pt will benefit from skilled OT services to max independence with ADL and mobility to facilitate safe D/C home with [facility] services. Pt will most likely be able to D/C home with [facility] services in a couple of days.    OT Assessment  Patient needs continued OT Services    Follow Up Recommendations  Home health OT    Barriers to Discharge None partner works first shift  Equipment Recommendations  Rolling walker with 5" wheels;3 in 1 bedside comode    Recommendations for Other Services    Frequency  Min 3X/week    Precautions / Restrictions Precautions Precautions: Fall   Pertinent Vitals/Pain No c/o pain O2 sats 100 RA    ADL  Grooming: Set up Where Assessed - Grooming: Unsupported sitting Upper Body Bathing: Set up Where Assessed - Upper Body Bathing: Unsupported sitting Lower Body Bathing: Minimal assistance Where Assessed - Lower Body Bathing: Supported sit to stand Upper Body Dressing: Set up Where Assessed - Upper Body Dressing: Unsupported sitting Lower Body Dressing: Minimal assistance Where Assessed - Lower Body Dressing: Supported sit to stand Toilet Transfer: Minimal assistance Toilet Transfer Method: Sit to stand;Stand pivot Acupuncturist: Other (comment) (bed - chair) Toileting - Clothing Manipulation and Hygiene: Supervision/safety Where Assessed - Toileting Clothing Manipulation and Hygiene: Standing Equipment Used: Gait belt Transfers/Ambulation Related to ADLs: minA. LOB during  ambulation requiring A to regain. most likely due to weakness ADL Comments: Limited by fatigue    OT Diagnosis: Generalized weakness  OT Problem List: Decreased strength;Decreased activity tolerance;Impaired balance (sitting and/or standing);Decreased safety awareness;Decreased knowledge of use of DME or AE OT Treatment Interventions: Self-care/ADL training;Therapeutic exercise;Energy conservation;DME and/or AE instruction;Therapeutic activities;Patient/family education;Balance training   OT Goals Acute Rehab OT Goals OT Goal Formulation: With patient Time For Goal Achievement: 09/10/12 Potential to Achieve Goals: Good ADL Goals Pt Will Perform Lower Body Bathing: with modified independence;Sit to stand from bed;Unsupported ADL Goal: Lower Body Bathing - Progress: Goal set today Pt Will Perform Lower Body Dressing: with modified independence;Sit to stand from bed;Unsupported ADL Goal: Lower Body Dressing - Progress: Goal set today Pt Will Transfer to Toilet: with modified independence;Ambulation;with DME;3-in-1 ADL Goal: Toilet Transfer - Progress: Goal set today Pt Will Perform Toileting - Clothing Manipulation: with modified independence;Sitting on 3-in-1 or toilet;Standing ADL Goal: Toileting - Clothing Manipulation - Progress: Goal set today Pt Will Perform Toileting - Hygiene: Independently;Standing at 3-in-1/toilet ADL Goal: Toileting - Hygiene - Progress: Goal set today Pt Will Perform Tub/Shower Transfer: Tub transfer;with supervision;with caregiver independent in assisting;with DME ADL Goal: Tub/Shower Transfer - Progress: Goal set today Additional ADL Goal #1: Pt will complete functional mobility within room @ RW level @ mod I level during ADL task. ADL Goal: Additional Goal #1 - Progress: Goal set today  Visit Information  Last OT Received On: 09/03/12 PT/OT Co-Evaluation/Treatment: Yes    Subjective Data   I want ot be able to go home   Prior Functioning     Home  Living Lives With: Family Available  Help at Discharge: Available PRN/intermittently;Other (Comment) (available at night) Type of Home: House Home Access: Stairs to enter [employer] of Steps: 2 Entrance Stairs-Rails: Right Home Layout: One level Bathroom Shower/Tub: Forensic scientist: Standard Bathroom Accessibility: No Home Adaptive Equipment: Straight cane Prior Function Level of Independence: Independent;Independent with assistive device(s) Able to Take Stairs?: Yes Driving: Yes Communication Communication: HOH         Vision/Perception  wears glasses at all times   Cognition  Overall Cognitive Status: Appears within functional limits for tasks assessed/performed Arousal/Alertness: Awake/alert Orientation Level: Appears intact for tasks assessed Behavior During Session: [facility] for tasks performed    Extremity/Trunk Assessment Right Upper Extremity Assessment RUE ROM/Strength/Tone: [facility] for tasks assessed Left Upper Extremity Assessment LUE ROM/Strength/Tone: [facility] for tasks assessed Right Lower Extremity Assessment RLE ROM/Strength/Tone: [facility] for tasks assessed Left Lower Extremity Assessment LLE ROM/Strength/Tone: [facility] for tasks assessed Trunk Assessment Trunk Assessment: Normal     Mobility Bed Mobility Bed Mobility: Supine to Sit;Sitting - Scoot to Edge of Bed Supine to Sit: 7: Independent Sitting - Scoot to Edge of Bed: 7: Independent Transfers Transfers: Sit to Stand;Stand to Sit Sit to Stand: 4: Min guard;With upper extremity assist;From bed Stand to Sit: 4: Min guard;With upper extremity assist;To chair/3-in-1 Details for Transfer Assistance: vc for safe technique               Balance Balance Balance Assessed:  (loss of balance during ambulation - likely due to fatigue)   End of Session OT - End of Session Equipment Utilized During Treatment: Gait belt Activity Tolerance: Patient tolerated treatment well Patient  left: in chair;with call bell/phone within reach Nurse Communication: Mobility status  GO Functional Assessment Tool Used: clinical judgement Functional Limitation: Self care Self Care Current Status (Z6109): At least 1 percent but less than 20 percent impaired, limited or restricted Self Care Goal Status (U0454): At least 1 percent but less than 20 percent impaired, limited or restricted   Matty Deamer,HILLARY 09/03/2012, 4:11 PM [facility], OTR/L  [phone] 09/03/2012

## 2012-09-03 NOTE — Progress Notes (Signed)
Physical Therapy Evaluation Patient Details Name: Benjamin Long MRN: [id number] DOB: 01/03/1946 Today's Date: 09/03/2012 Time: 2440-1027 PT Time Calculation (min): 28 min  PT Assessment / Plan / Recommendation Clinical Impression  66 yo male admitted with severe anemia in setting of other comorbidities including ESRD (peritoneal dialysis), Hep B, Anal Ca, HIV; Presents to PT with decr activity tol, and unsteady on feet; Will benefit from PT to maximize indepepndence and safety with mobility, amb, steps to enable safe dc home independently    PT Assessment  Patient needs continued PT services    Follow Up Recommendations  Home health PT    Does the patient have the potential to tolerate intense rehabilitation      Barriers to Discharge Decreased caregiver support Must be completely independent to go home    Equipment Recommendations  Rolling walker with 5" wheels;3 in 1 bedside comode    Recommendations for Other Services     Frequency Min 3X/week    Precautions / Restrictions Precautions Precautions: Fall Restrictions Weight Bearing Restrictions: No   Pertinent Vitals/Pain no apparent distress O2 sats 100% and HR 73 post amb on Room Air       Mobility  Bed Mobility Bed Mobility: Supine to Sit;Sitting - Scoot to Edge of Bed Supine to Sit: 7: Independent Sitting - Scoot to Edge of Bed: 7: Independent Details for Bed Mobility Assistance: Cues to self-monitor for activity tol Transfers Transfers: Sit to Stand;Stand to Sit Sit to Stand: 4: Min guard;With upper extremity assist;From bed Stand to Sit: 4: Min guard;With upper extremity assist;To chair/3-in-1 Details for Transfer Assistance: vc for safe technique Ambulation/Gait Ambulation/Gait Assistance: 4: Min guard;3: Mod assist Ambulation Distance (Feet): 100 Feet Assistive device: Straight cane;1 person hand held assist Ambulation/Gait Assistance Details: Overall moving well, reaches out for UE support  appropriately (therapist shoulder, cane, or sturdy furniture like sink); Noted one significant loss of balance requiring mod assist to regain balance; Cues to self-monitor for activity tol Gait Pattern: Step-through pattern Gait velocity: decr    Shoulder Instructions     Exercises     PT Diagnosis: Generalized weakness  PT Problem List: Decreased strength;Decreased activity tolerance;Decreased balance;Decreased mobility PT Treatment Interventions: DME instruction;Gait training;Stair training;Functional mobility training;Therapeutic activities;Therapeutic exercise;Balance training;Patient/family education   PT Goals Acute Rehab PT Goals PT Goal Formulation: With patient Time For Goal Achievement: 09/03/12 Potential to Achieve Goals: Good Pt will go Sit to Stand: Independently PT Goal: Sit to Stand - Progress: Goal set today Pt will go Stand to Sit: Independently PT Goal: Stand to Sit - Progress: Goal set today Pt will Ambulate: >150 feet;with modified independence;with least restrictive assistive device PT Goal: Ambulate - Progress: Goal set today Pt will Go Up / Down Stairs: 1-2 stairs;Independently;with rail(s) PT Goal: Up/Down Stairs - Progress: Goal set today  Visit Information  Last PT Received On: 09/03/12 Assistance Needed: +1 PT/OT Co-Evaluation/Treatment: Yes    Subjective Data  Subjective: Agreeable to OOB; Reports he feels he's getting old Patient Stated Goal: get better and go home   Prior Functioning  Home Living Lives With: Family Available Help at Discharge: Available PRN/intermittently;Other (Comment) Type of Home: House Home Access: Stairs to enter [employer] of Steps: 2 Entrance Stairs-Rails: Right Home Layout: One level Bathroom Shower/Tub: Forensic scientist: Standard Bathroom Accessibility: No Home Adaptive Equipment: Straight cane Prior Function Level of Independence: Independent;Independent with assistive  device(s) Able to Take Stairs?: Yes Driving: Yes Comments: Retired Psychologist, occupational: [facility]  Cognition  Overall Cognitive Status: Appears within functional limits for tasks assessed/performed Arousal/Alertness: Awake/alert Orientation Level: Appears intact for tasks assessed Behavior During Session: [facility] for tasks performed    Extremity/Trunk Assessment Right Upper Extremity Assessment RUE ROM/Strength/Tone: [facility] for tasks assessed Left Upper Extremity Assessment LUE ROM/Strength/Tone: WFL for tasks assessed Right Lower Extremity Assessment RLE ROM/Strength/Tone: [facility] for tasks assessed Left Lower Extremity Assessment LLE ROM/Strength/Tone: [facility] for tasks assessed Trunk Assessment Trunk Assessment: Normal   Balance Balance Balance Assessed:  (loss of balance during ambulation - likely due to fatigue)  End of Session PT - End of Session Equipment Utilized During Treatment: Gait belt Activity Tolerance: Patient tolerated treatment well Patient left: in chair;with call bell/phone within reach Nurse Communication: Mobility status  GP     Olen Pel [place], [place] [phone]  09/03/2012, 4:35 PM

## 2012-09-03 NOTE — Progress Notes (Signed)
[facility] ROUNDING NOTE   Subjective:   Interval History: doing well post transfusion with no complaints.  Objective:  Vital signs in last 24 hours:  Temp:  [97.1 F (36.2 C)-99.2 F (37.3 C)] 97.1 F (36.2 C) (11/06 0439) Pulse Rate:  [65-80] 65  (11/06 0439) Resp:  [16-20] 18  (11/06 0439) BP: (83-120)/(45-68) 91/45 mmHg (11/06 0439) SpO2:  [96 %-100 %] 96 % (11/06 0439) Weight:  [98.3 kg (216 lb 11.4 oz)-101.379 kg (223 lb 8 oz)] 101.379 kg (223 lb 8 oz) (11/05 2001)  Weight change:  Filed Weights   09/02/12 1211 09/02/12 2001  Weight: 98.3 kg (216 lb 11.4 oz) 101.379 kg (223 lb 8 oz)    Intake/Output: I/O last 3 completed shifts: In: 2295.4 [P.O.:240; I.V.:1250; Blood:805.4] Out: 150 [Urine:150]   Intake/Output this shift:  Total I/O In: -  Out: 1 [Stool:1]  CVS- RRR RS- CTA ABD- BS present soft non-distended PD catheter EXT- no edema   Basic Metabolic Panel:  Lab 09/02/12 5621  NA 134*  K 5.1  CL 97  CO2 25  GLUCOSE 433*  BUN 88*  CREATININE 12.27*  CALCIUM 8.2*  MG --  PHOS --    Liver Function Tests: No results found for this basename: AST:5,ALT:5,ALKPHOS:5,BILITOT:5,PROT:5,ALBUMIN:5 in the last 168 hours No results found for this basename: LIPASE:5,AMYLASE:5 in the last 168 hours No results found for this basename: AMMONIA:3 in the last 168 hours  CBC:  Lab 09/03/12 0515 09/02/12 1518  WBC 4.9 5.0  NEUTROABS -- --  HGB 6.7* 4.2*  HCT 20.2* 13.6*  MCV 87.1 92.5  PLT 133* 133*    Cardiac Enzymes: No results found for this basename: CKTOTAL:5,CKMB:5,CKMBINDEX:5,TROPONINI:5 in the last 168 hours  BNP: No components found with this basename: POCBNP:5  CBG:  Lab 09/03/12 0741 09/02/12 2151 09/02/12 1628 09/02/12 1202  GLUCAP 163* 241* 367* 249*    Microbiology: Results for orders placed during the hospital encounter of 02/14/10  URINE CULTURE     Status: Normal   Collection Time   02/14/10  6:12 PM   Component Value Range Status Comment   Specimen Description URINE, CLEAN CATCH   Final    Special Requests NONE   Final    Colony Count 90,000 COLONIES/ML   Final    Culture     Final    Value: Multiple bacterial morphotypes present, none predominant. Suggest appropriate recollection if clinically indicated.   Report Status 02/16/2010 FINAL   Final     Coagulation Studies: No results found for this basename: LABPROT:5,INR:5 in the last 72 hours  Urinalysis: No results found for this basename: COLORURINE:2,APPERANCEUR:2,LABSPEC:2,PHURINE:2,GLUCOSEU:2,HGBUR:2,BILIRUBINUR:2,KETONESUR:2,PROTEINUR:2,UROBILINOGEN:2,NITRITE:2,LEUKOCYTESUR:2 in the last 72 hours    Imaging: Portable Chest 1 View  09/02/2012  *RADIOLOGY REPORT*  Clinical Data: Cough and shortness of breath.  PORTABLE CHEST - 1 VIEW  Comparison: 02/14/2010  Findings: Heart size and pulmonary vascularity are normal.  There is slight accentuation of the interstitial markings in the left perihilar region without a consolidative infiltrate or effusion.  Right lung is clear.  No acute osseous abnormality.  Previous resection of the distal clavicle.  Moderate arthritis the left AC joint.  IMPRESSION: Bronchitic changes of the left perihilar region which may be chronic.   Original Report Authenticated By: Francene Boyers, M.D.      Medications:      . dialysis solution 1.5% low-MG/low-CA 5 L (09/02/12 2225)  . dialysis solution 2.5% low-MG/low-CA        . acetaminophen  650 mg Oral Once  . diphenhydrAMINE  25 mg Oral Once  . enoxaparin (LOVENOX) injection  30 mg Subcutaneous q1800  . insulin aspart  0-15 Units Subcutaneous TID WC  . insulin aspart  0-5 Units Subcutaneous QHS  . pantoprazole  40 mg Oral Daily  . sodium chloride  3 mL Intravenous Q12H  . sodium chloride  3 mL Intravenous Q12H   sodium chloride, acetaminophen, acetaminophen, bisacodyl, ondansetron (ZOFRAN) IV, ondansetron, sodium chloride  Assessment/ Plan:    ESRD-continue peritoneal dialysis cycler  ANEMIA-profound anemia work up.per primary no obvious blood loss  MBD-follow calcium and phosphorus and adjust binders  HTN/VOL appears euvolemic at present with maintained blood pressure  ACCESS-fistula left and PD catheter Will follow patient and get records from [facility]  Patient usually does 4 exchanges at home on cycler    LOS: 1 Benjamin Long W @TODAY @9 :37 AM

## 2012-09-03 NOTE — Progress Notes (Signed)
[facility] PROGRESS NOTE  Kaymon Denomme ZOX:[id number] DOB: 26-May-1946 DOA: 09/02/2012 PCP: Evlyn Courier, MD  Assessment/Plan:  #1 severe symptomatic iron deficiency anemia Questionable etiology. Patient with no overt GI bleed. Anemia panel with iron levels less than 10. Patient's hemoglobin on admission was 4.2. Patient received 3 units of packed red blood cells and hemoglobin up to 6.7 this morning. We'll transfuse another 2 units of packed red blood cells. FOBT is pending. Patient states he's been followed by hematologist as outpatient and was told his anemia was likely secondary to end-stage renal disease. Patient is to receive iron S. Follow H&H.  #2 end-stage renal disease on peritoneal dialysis Per nephrology.  #3 HIV We'll resume patient's tenofovir. Will consult with ID for further evaluation and management. Will check a CD4 count. Check a viral load.   #4 hypertension Stable. Resume patient's Coreg and Lasix.  #5 type 2 diabetes Resume half home dose Lantus. We'll place on NovoLog meal coverage 5 units 3 times daily. Continue Reglan. Sliding scale insulin.  #6 status post fall/weakness Likely secondary to symptomatic severe anemia. PT /OT. Follow.  Code Status: Full Family Communication: Updated patient. No family at bedside. Disposition Plan: Home when medically stable   Consultants:  Nephrology: Dr. Hyman Hopes 09/02/2012  Procedures:  Chest x-ray 09/02/2012  3 units of packed red blood cells 09/02/2012   2 units packed red blood cells 09/03/2012  Antibiotics:  None  HPI/Subjective: Patient still with some dizziness however feels better than on admission.  Objective: Filed Vitals:   09/03/12 1047 09/03/12 1122 09/03/12 1230 09/03/12 1333  BP: 120/63 111/60 122/65 116/65  Pulse: 74 67 68 72  Temp: 97.8 F (36.6 C) 97.4 F (36.3 C) 98.2 F (36.8 C) 98.5 F (36.9 C)  TempSrc: Oral Oral Oral Oral  Resp: 16 11 12 14   Height:      Weight:        SpO2:   98% 99%    Intake/Output Summary (Last 24 hours) at 09/03/12 1451 Last data filed at 09/03/12 1425  Gross per 24 hour  Intake 3433.42 ml  Output    151 ml  Net 3282.42 ml   Filed Weights   09/02/12 1211 09/02/12 2001  Weight: 98.3 kg (216 lb 11.4 oz) 101.379 kg (223 lb 8 oz)    Exam:   General:  NAD  Cardiovascular: RRR  Respiratory: CTAB  Abdomen: Soft, nontender, nondistended, positive bowel sounds  Data Reviewed: Basic Metabolic Panel:  Lab 09/03/12 8119 09/02/12 1518  NA 133* 134*  K 4.3 5.1  CL 94* 97  CO2 24 25  GLUCOSE 289* 433*  BUN 80* 88*  CREATININE 11.09* 12.27*  CALCIUM 8.0* 8.2*  MG -- --  PHOS -- --   Liver Function Tests:  Lab 09/03/12 0923  AST 17  ALT 12  ALKPHOS 108  BILITOT 0.4  PROT 7.1  ALBUMIN 2.5*   No results found for this basename: LIPASE:5,AMYLASE:5 in the last 168 hours No results found for this basename: AMMONIA:5 in the last 168 hours CBC:  Lab 09/03/12 0923 09/03/12 0515 09/02/12 1518  WBC -- 4.9 5.0  NEUTROABS -- -- --  HGB 7.6* 6.7* 4.2*  HCT 23.4* 20.2* 13.6*  MCV -- 87.1 92.5  PLT -- 133* 133*   Cardiac Enzymes: No results found for this basename: CKTOTAL:5,CKMB:5,CKMBINDEX:5,TROPONINI:5 in the last 168 hours BNP (last 3 results) No results found for this basename: PROBNP:3 in the last 8760 hours CBG:  Lab 09/03/12 1142 09/03/12 0741  09/02/12 2151 09/02/12 1628 09/02/12 1202  GLUCAP 287* 163* 241* 367* 249*    No results found for this or any previous visit (from the past 240 hour(s)).   Studies: Portable Chest 1 View  09/02/2012  *RADIOLOGY REPORT*  Clinical Data: Cough and shortness of breath.  PORTABLE CHEST - 1 VIEW  Comparison: 02/14/2010  Findings: Heart size and pulmonary vascularity are normal.  There is slight accentuation of the interstitial markings in the left perihilar region without a consolidative infiltrate or effusion.  Right lung is clear.  No acute osseous abnormality.   Previous resection of the distal clavicle.  Moderate arthritis the left AC joint.  IMPRESSION: Bronchitic changes of the left perihilar region which may be chronic.   Original Report Authenticated By: Francene Boyers, M.D.     Scheduled Meds:   . [COMPLETED] acetaminophen  650 mg Oral Once  . calcium acetate  667 mg Oral TID WC  . carvedilol  3.125 mg Oral BID WC  . darbepoetin (ARANESP) injection - DIALYSIS  100 mcg Subcutaneous Q Wed-HD  . [COMPLETED] diphenhydrAMINE  25 mg Oral Once  . enoxaparin (LOVENOX) injection  30 mg Subcutaneous q1800  . furosemide  40 mg Oral Daily  . gabapentin  100 mg Oral Daily  . insulin aspart  0-15 Units Subcutaneous TID WC  . insulin aspart  0-5 Units Subcutaneous QHS  . insulin aspart  5 Units Subcutaneous TID WC  . insulin glargine  14 Units Subcutaneous BH-q7a  . latanoprost  1 drop Both Eyes QHS  . loratadine  10 mg Oral Daily  . methocarbamol  750 mg Oral QID  . metoCLOPramide  5 mg Oral TID AC  . pantoprazole  40 mg Oral Daily  . polyvinyl alcohol  1 drop Both Eyes QID  . sevelamer  800 mg Oral TID WC  . simvastatin  10 mg Oral q1800  . sodium chloride  3 mL Intravenous Q12H  . sodium chloride  3 mL Intravenous Q12H  . tenofovir  300 mg Oral Q7 days   Continuous Infusions:   . dialysis solution 1.5% low-MG/low-CA 5 L (09/02/12 2225)  . dialysis solution 2.5% low-MG/low-CA      Principal Problem:  *Anemia Active Problems:  ESRD on peritoneal dialysis  HIV disease  Hypertension  DM type 2 causing renal disease    Time spent: > 35 mins    [facility]  [facility] Pager [phone]. If 8PM-8AM, please contact night-coverage at [email or web address], password [facility] 09/03/2012, 2:51 PM  LOS: 1 day

## 2012-09-04 DIAGNOSIS — B20 Human immunodeficiency virus [HIV] disease: Secondary | ICD-10-CM

## 2012-09-04 DIAGNOSIS — M5430 Sciatica, unspecified side: Secondary | ICD-10-CM | POA: Diagnosis not present

## 2012-09-04 LAB — GLUCOSE, CAPILLARY
Glucose-Capillary: 235 mg/dL — ABNORMAL HIGH (ref 70–99)
Glucose-Capillary: 228 mg/dL — ABNORMAL HIGH (ref 70–99)

## 2012-09-04 LAB — TYPE AND SCREEN
ABO/RH(D): O POS
Antibody Screen: NEGATIVE
Unit division: 0
Unit division: 0
Unit division: 0
Unit division: 0

## 2012-09-04 LAB — CBC
HCT: 26.3 % — ABNORMAL LOW (ref 39.0–52.0)
HCT: 28 % — ABNORMAL LOW (ref 39.0–52.0)
Hemoglobin: 9 g/dL — ABNORMAL LOW (ref 13.0–17.0)
Hemoglobin: 9.5 g/dL — ABNORMAL LOW (ref 13.0–17.0)
MCH: 29.2 pg (ref 26.0–34.0)
MCH: 29.9 pg (ref 26.0–34.0)
MCHC: 33.9 g/dL (ref 30.0–36.0)
MCHC: 34.2 g/dL (ref 30.0–36.0)
MCV: 86.2 fL (ref 78.0–100.0)
MCV: 87.4 fL (ref 78.0–100.0)
Platelets: 139 10*3/uL — ABNORMAL LOW (ref 150–400)
Platelets: 139 10*3/uL — ABNORMAL LOW (ref 150–400)
RBC: 3.01 MIL/uL — ABNORMAL LOW (ref 4.22–5.81)
RBC: 3.25 MIL/uL — ABNORMAL LOW (ref 4.22–5.81)
RDW: 14.9 % (ref 11.5–15.5)
RDW: 15 % (ref 11.5–15.5)
WBC: 5.6 10*3/uL (ref 4.0–10.5)
WBC: 5.8 10*3/uL (ref 4.0–10.5)

## 2012-09-04 LAB — RENAL FUNCTION PANEL
Albumin: 2.4 g/dL — ABNORMAL LOW (ref 3.5–5.2)
BUN: 81 mg/dL — ABNORMAL HIGH (ref 6–23)
Calcium: 7.4 mg/dL — ABNORMAL LOW (ref 8.4–10.5)
Creatinine, Ser: 11.04 mg/dL — ABNORMAL HIGH (ref 0.50–1.35)
Phosphorus: 6.6 mg/dL — ABNORMAL HIGH (ref 2.3–4.6)

## 2012-09-04 LAB — T-HELPER CELLS (CD4) COUNT (NOT AT ARMC)
CD4 % Helper T Cell: 16 % — ABNORMAL LOW (ref 33–55)
CD4 T Cell Abs: 160 uL — ABNORMAL LOW (ref 400–2700)

## 2012-09-04 LAB — FOLATE: Folate: >24 ng/mL (ref >5.4)

## 2012-09-04 MED ORDER — INSULIN GLARGINE 100 UNIT/ML ~~LOC~~ SOLN
16.0000 [IU] | SUBCUTANEOUS | Status: DC
  Administered 2012-09-05: 16 [IU] via SUBCUTANEOUS

## 2012-09-04 MED ORDER — FAMOTIDINE 20 MG PO TABS
20.0000 mg | ORAL_TABLET | Freq: Every day | ORAL | Status: DC
  Administered 2012-09-04 – 2012-09-05 (×2): 20 mg via ORAL
  Filled 2012-09-04 (×2): qty 1

## 2012-09-04 MED ORDER — SULFAMETHOXAZOLE-TMP DS 800-160 MG PO TABS: 1.0000 | ORAL_TABLET | Freq: Every day | ORAL | Status: DC

## 2012-09-04 MED ORDER — PRAVASTATIN SODIUM 20 MG PO TABS
20.0000 mg | ORAL_TABLET | Freq: Every day | ORAL | Status: DC
  Administered 2012-09-04 – 2012-09-05 (×2): 20 mg via ORAL
  Filled 2012-09-04 (×2): qty 1

## 2012-09-04 MED ORDER — FOSAMPRENAVIR CALCIUM 700 MG PO TABS
700.0000 mg | ORAL_TABLET | Freq: Two times a day (BID) | ORAL | Status: DC
Start: 2012-09-05 — End: 2012-09-05
  Filled 2012-09-04 (×2): qty 1

## 2012-09-04 MED ORDER — PREDNISONE 20 MG PO TABS
40.0000 mg | ORAL_TABLET | Freq: Every day | ORAL | Status: DC
  Administered 2012-09-05: 40 mg via ORAL
  Filled 2012-09-04 (×2): qty 2

## 2012-09-04 MED ORDER — RITONAVIR 100 MG PO CAPS
100.0000 mg | ORAL_CAPSULE | Freq: Two times a day (BID) | ORAL | Status: DC
  Administered 2012-09-04 – 2012-09-05 (×4): 100 mg via ORAL
  Filled 2012-09-04 (×5): qty 1

## 2012-09-04 MED ORDER — FOSAMPRENAVIR CALCIUM 700 MG PO TABS
1400.0000 mg | ORAL_TABLET | Freq: Two times a day (BID) | ORAL | Status: DC
  Filled 2012-09-04 (×2): qty 2

## 2012-09-04 MED ORDER — SULFAMETHOXAZOLE-TRIMETHOPRIM 400-80 MG PO TABS
1.0000 | ORAL_TABLET | Freq: Every day | ORAL | Status: DC
  Administered 2012-09-04 – 2012-09-05 (×2): 1 via ORAL
  Filled 2012-09-04 (×2): qty 1

## 2012-09-04 MED ORDER — FOSAMPRENAVIR CALCIUM 700 MG PO TABS: 1400.0000 mg | ORAL_TABLET | Freq: Two times a day (BID) | ORAL | Status: DC

## 2012-09-04 MED ORDER — RALTEGRAVIR POTASSIUM 400 MG PO TABS
400.0000 mg | ORAL_TABLET | Freq: Two times a day (BID) | ORAL | Status: DC
  Administered 2012-09-04 – 2012-09-05 (×3): 400 mg via ORAL
  Filled 2012-09-04 (×5): qty 1

## 2012-09-04 MED ORDER — RITONAVIR 100 MG PO CAPS
100.0000 mg | ORAL_CAPSULE | Freq: Two times a day (BID) | ORAL | Status: DC
  Filled 2012-09-04 (×2): qty 1

## 2012-09-04 MED ORDER — FOSAMPRENAVIR CALCIUM 700 MG PO TABS
1400.0000 mg | ORAL_TABLET | Freq: Two times a day (BID) | ORAL | Status: DC
  Filled 2012-09-04 (×3): qty 2

## 2012-09-04 MED ORDER — OXYCODONE HCL 5 MG PO TABS
5.0000 mg | ORAL_TABLET | ORAL | Status: DC | PRN
  Filled 2012-09-04: qty 1

## 2012-09-04 NOTE — Progress Notes (Signed)
Physical Therapy Treatment Patient Details Name: Benjamin Long MRN: [id number] DOB: [birth year] Today's Date: 09/04/2012 Time: 4098-1191 PT Time Calculation (min): 44 min  PT Assessment / Plan / Recommendation Comments on Treatment Session  Pt. has had onset of right hip/anterolateral thigh pain and some weakness in same leg.  discussed with Dr. Janee Morn and he believes pt. may be experiencing sciatica.  He plans to initiate a steroid taper .  In the mean time, pt. was not stable enough in his mobility to attempt steps.  Will see again tomorrow and plan accordingly as Dc may occur tomorrow.    Follow Up Recommendations  Home health PT     Does the patient have the potential to tolerate intense rehabilitation     Barriers to Discharge        Equipment Recommendations  Rolling walker with 5" wheels;3 in 1 bedside comode    Recommendations for Other Services    Frequency Min 3X/week   Plan Discharge plan remains appropriate    Precautions / Restrictions Precautions Precautions: Fall Precaution Comments: new onset R LE pain/weakness Restrictions Weight Bearing Restrictions: No   Pertinent Vitals/Pain Pain 7/10 right hip and thigh, RN and MD aware    Mobility  Bed Mobility Bed Mobility: Not assessed Supine to Sit: 6: Modified independent (Device/Increase time) Sitting - Scoot to Edge of Bed: 6: Modified independent (Device/Increase time) Transfers Transfers: Sit to Stand;Stand to Sit Sit to Stand: 4: Min guard;With upper extremity assist;From bed Stand to Sit: 4: Min guard;With upper extremity assist;To chair/3-in-1 Details for Transfer Assistance: vc for safe technique Ambulation/Gait Ambulation/Gait Assistance: 4: Min assist;3: Mod assist Ambulation Distance (Feet): 100 Feet ({) Assistive device: Rolling walker Ambulation/Gait Assistance Details: Pt. ambulated with notable weakness in right LE.  Mild right knee recurvatum and decreased motor control.  Buckling of knees,  especially right, noted on one occasion.  Needed mod assist to stabilize and prevent fall.  Pt. needed  one seated and 2 standing rest breaks to walk 100 feet. Gait Pattern: Step-to pattern;Trunk flexed Gait velocity: decr    Exercises     PT Diagnosis:    PT Problem List:   PT Treatment Interventions:     PT Goals Acute Rehab PT Goals Time For Goal Achievement: 09/11/12 Potential to Achieve Goals: Good PT Goal: Sit to Stand - Progress: Progressing toward goal PT Goal: Stand to Sit - Progress: Progressing toward goal PT Goal: Ambulate - Progress: Progressing toward goal  Visit Information  Last PT Received On: 09/04/12 Assistance Needed: +2 (second person to push recliner due to knee buckling)    Subjective Data  Subjective: New onset of pain in right hip/thigh   Cognition  Overall Cognitive Status: Appears within functional limits for tasks assessed/performed Arousal/Alertness: Awake/alert Orientation Level: Appears intact for tasks assessed Behavior During Session: [facility] for tasks performed    Balance     End of Session PT - End of Session Equipment Utilized During Treatment: Gait belt Activity Tolerance: Patient limited by fatigue;Other (comment) (limited by weakness in right LE) Patient left: in chair;with call bell/phone within reach Nurse Communication: Mobility status   GP     Ferman Hamming 09/04/2012, 3:35 PM Weldon Picking PT Acute Rehab Services [phone] [place] [phone]

## 2012-09-04 NOTE — Progress Notes (Signed)
[facility] ROUNDING NOTE   Subjective:   Interval History: complains of sciatica type pain right leg  Objective:  Vital signs in last 24 hours:  Temp:  [97.1 F (36.2 C)-98.9 F (37.2 C)] 98.9 F (37.2 C) (11/07 0911) Pulse Rate:  [66-74] 66  (11/07 0911) Resp:  [11-18] 18  (11/07 0911) BP: (111-143)/(60-75) 125/62 mmHg (11/07 0911) SpO2:  [96 %-100 %] 99 % (11/07 0911) Weight:  [102.1 kg (225 lb 1.4 oz)] 102.1 kg (225 lb 1.4 oz) (11/06 2023)  Weight change: 3.8 kg (8 lb 6 oz) Filed Weights   09/02/12 1211 09/02/12 2001 09/03/12 2023  Weight: 98.3 kg (216 lb 11.4 oz) 101.379 kg (223 lb 8 oz) 102.1 kg (225 lb 1.4 oz)    Intake/Output: I/O last 3 completed shifts: In: 3053.4 [P.O.:440; I.V.:1023; Blood:1240.4; Other:350] Out: 251 [Urine:250; Stool:1]   Intake/Output this shift:  Total I/O In: 240 [P.O.:240] Out: 200 [Urine:200]  CVS- RRR RS- CTA ABD- BS present soft non-distended PD cath clean EXT- no edema   Basic Metabolic Panel:  Lab 09/04/12 1610 09/03/12 0923 09/02/12 1518  NA 136 133* 134*  K 4.7 4.3 5.1  CL 97 94* 97  CO2 23 24 25   GLUCOSE 237* 289* 433*  BUN 81* 80* 88*  CREATININE 11.04* 11.09* 12.27*  CALCIUM 7.4* 8.0* 8.2*  MG -- -- --  PHOS 6.6* -- --    Liver Function Tests:  Lab 09/04/12 0745 09/03/12 0923  AST -- 17  ALT -- 12  ALKPHOS -- 108  BILITOT -- 0.4  PROT -- 7.1  ALBUMIN 2.4* 2.5*   No results found for this basename: LIPASE:5,AMYLASE:5 in the last 168 hours No results found for this basename: AMMONIA:3 in the last 168 hours  CBC:  Lab 09/04/12 0745 09/04/12 0011 09/03/12 0923 09/03/12 0515 09/02/12 1518  WBC 5.8 5.6 -- 4.9 5.0  NEUTROABS -- -- -- -- --  HGB 9.5* 9.0* 7.6* 6.7* 4.2*  HCT 28.0* 26.3* 23.4* 20.2* 13.6*  MCV 86.2 87.4 -- 87.1 92.5  PLT 139* 139* -- 133* 133*    Cardiac Enzymes: No results found for this basename: CKTOTAL:5,CKMB:5,CKMBINDEX:5,TROPONINI:5 in the last 168  hours  BNP: No components found with this basename: POCBNP:5  CBG:  Lab 09/04/12 0731 09/03/12 2026 09/03/12 1642 09/03/12 1142 09/03/12 0741  GLUCAP 235* 237* 208* 287* 163*    Microbiology: Results for orders placed during the hospital encounter of 02/14/10  URINE CULTURE     Status: Normal   Collection Time   02/14/10  6:12 PM      Component Value Range Status Comment   Specimen Description URINE, CLEAN CATCH   Final    Special Requests NONE   Final    Colony Count 90,000 COLONIES/ML   Final    Culture     Final    Value: Multiple bacterial morphotypes present, none predominant. Suggest appropriate recollection if clinically indicated.   Report Status 02/16/2010 FINAL   Final     Coagulation Studies: No results found for this basename: LABPROT:5,INR:5 in the last 72 hours  Urinalysis: No results found for this basename: COLORURINE:2,APPERANCEUR:2,LABSPEC:2,PHURINE:2,GLUCOSEU:2,HGBUR:2,BILIRUBINUR:2,KETONESUR:2,PROTEINUR:2,UROBILINOGEN:2,NITRITE:2,LEUKOCYTESUR:2 in the last 72 hours    Imaging: Portable Chest 1 View  09/02/2012  *RADIOLOGY REPORT*  Clinical Data: Cough and shortness of breath.  PORTABLE CHEST - 1 VIEW  Comparison: 02/14/2010  Findings: Heart size and pulmonary vascularity are normal.  There is slight accentuation of the interstitial markings in the left perihilar region without a  consolidative infiltrate or effusion.  Right lung is clear.  No acute osseous abnormality.  Previous resection of the distal clavicle.  Moderate arthritis the left AC joint.  IMPRESSION: Bronchitic changes of the left perihilar region which may be chronic.   Original Report Authenticated By: Francene Boyers, M.D.      Medications:      . dialysis solution 1.5% low-MG/low-CA 5 L (09/02/12 2225)  . dialysis solution 2.5% low-MG/low-CA        . [COMPLETED] acetaminophen  650 mg Oral Once  . calcium acetate  2,668 mg Oral TID WC  . carvedilol  3.125 mg Oral BID WC  . darbepoetin  (ARANESP) injection - DIALYSIS  100 mcg Subcutaneous Q Wed-HD  . [COMPLETED] diphenhydrAMINE  25 mg Oral Once  . enoxaparin (LOVENOX) injection  30 mg Subcutaneous q1800  . furosemide  40 mg Oral Daily  . gabapentin  100 mg Oral Daily  . insulin aspart  0-15 Units Subcutaneous TID WC  . insulin aspart  0-5 Units Subcutaneous QHS  . insulin aspart  5 Units Subcutaneous TID WC  . insulin glargine  14 Units Subcutaneous BH-q7a  . latanoprost  1 drop Both Eyes QHS  . metoCLOPramide  5 mg Oral TID AC  . pantoprazole  40 mg Oral Daily  . sevelamer  800 mg Oral TID WC  . simvastatin  10 mg Oral q1800  . sodium chloride  3 mL Intravenous Q12H  . sodium chloride  3 mL Intravenous Q12H  . tenofovir  300 mg Oral Q7 days  . [DISCONTINUED] calcium acetate  667 mg Oral TID WC  . [DISCONTINUED] loratadine  10 mg Oral Daily  . [DISCONTINUED] methocarbamol  750 mg Oral QID  . [DISCONTINUED] polyvinyl alcohol  1 drop Both Eyes QID   sodium chloride, acetaminophen, acetaminophen, bisacodyl, ondansetron (ZOFRAN) IV, ondansetron, oxyCODONE, simethicone, sodium chloride, [DISCONTINUED] acetaminophen  Assessment/ Plan:  ESRD-continue peritoneal dialysis cycler  ANEMIA-profound anemia work up.per primary no obvious blood loss  MBD-follow calcium and phosphorus and adjust binders  HTN/VOL appears euvolemic at present with maintained blood pressure  ACCESS-fistula left and PD catheter Will follow patient and get records from [facility]  Patient usually does 4 exchanges at home on cycler   From my perspective patient can leave today   LOS: 2 Benjamin Long W @TODAY @9 :49 AM

## 2012-09-04 NOTE — Progress Notes (Signed)
[facility] PROGRESS NOTE  Benjamin Long ZOX:[id number] DOB: [birth year] DOA: 09/02/2012 PCP: Evlyn Courier, MD  Assessment/Plan:  #1 severe symptomatic iron deficiency anemia Questionable etiology. Patient with no overt GI bleed. Anemia panel with iron levels less than 10. Patient's hemoglobin on admission was 4.2. Patient received 5 units of packed red blood cells and hemoglobin up to 9.5 today.  FOBT is pending. ??? Marrow involvement with HIV. Patient states he's been followed by hematologist as outpatient and was told his anemia was likely secondary to end-stage renal disease. Patient is to receive aranesp. Follow H&H.  #2 end-stage renal disease on peritoneal dialysis Per nephrology.  #3 HIV/AIDS CD4 count is 160. Viral load is pending. We'll resume patient's anti-retroviral medications. We'll place patient on Bactrim single-stranded 1 tablet daily for PCP prophylaxis, dose discussed with Dr. Hyman Hopes of nephrology. Will consult with ID for further evaluation and management.   #4 hypertension Stable. Continue Coreg and Lasix.  #5 type 2 diabetes CBGs ranging from 228 - 237. Increase Lantus to 16 units. Continue NovoLog meal coverage 5 units 3 times daily. Continue Reglan. Sliding scale insulin.  #6 probable sciatica We'll place on a three-day course of oral prednisone and follow.  #7 status post fall/weakness Likely secondary to symptomatic severe anemia. PT /OT. Follow.  Code Status: Full Family Communication: Updated patient. No family at bedside. Disposition Plan: Home when medically stable   Consultants:  Nephrology: Dr. Hyman Hopes 09/02/2012  Procedures:  Chest x-ray 09/02/2012  3 units of packed red blood cells 09/02/2012   2 units packed red blood cells 09/03/2012  Antibiotics:  Prophylactic bactrim 08/04/12  HPI/Subjective: Patient states feeling much better. Patient sates dizziness improved. Patient c/o right leg pain shooting down from hip to knee  today.  Objective: Filed Vitals:   09/03/12 2023 09/04/12 0540 09/04/12 0911 09/04/12 1300  BP: 143/69 123/67 125/62 128/67  Pulse: 74 71 66 71  Temp: 98.8 F (37.1 C) 98.5 F (36.9 C) 98.9 F (37.2 C) 98.4 F (36.9 C)  TempSrc: Oral Oral Oral Oral  Resp: 16 16 18 18   Height: 5\' 11"  (1.803 m)     Weight: 102.1 kg (225 lb 1.4 oz)     SpO2: 100% 96% 99% 99%    Intake/Output Summary (Last 24 hours) at 09/04/12 1526 Last data filed at 09/04/12 1300  Gross per 24 hour  Intake  586.5 ml  Output    300 ml  Net  286.5 ml   Filed Weights   09/02/12 1211 09/02/12 2001 09/03/12 2023  Weight: 98.3 kg (216 lb 11.4 oz) 101.379 kg (223 lb 8 oz) 102.1 kg (225 lb 1.4 oz)    Exam:   General:  NAD  Cardiovascular: RRR  Respiratory: CTAB  Abdomen: Soft, nontender, nondistended, positive bowel sounds  Data Reviewed: Basic Metabolic Panel:  Lab 09/04/12 8119 09/03/12 0923 09/02/12 1518  NA 136 133* 134*  K 4.7 4.3 5.1  CL 97 94* 97  CO2 23 24 25   GLUCOSE 237* 289* 433*  BUN 81* 80* 88*  CREATININE 11.04* 11.09* 12.27*  CALCIUM 7.4* 8.0* 8.2*  MG -- -- --  PHOS 6.6* -- --   Liver Function Tests:  Lab 09/04/12 0745 09/03/12 0923  AST -- 17  ALT -- 12  ALKPHOS -- 108  BILITOT -- 0.4  PROT -- 7.1  ALBUMIN 2.4* 2.5*   No results found for this basename: LIPASE:5,AMYLASE:5 in the last 168 hours No results found for this basename: AMMONIA:5 in the last  168 hours CBC:  Lab 09/04/12 0745 09/04/12 0011 09/03/12 0923 09/03/12 0515 09/02/12 1518  WBC 5.8 5.6 -- 4.9 5.0  NEUTROABS -- -- -- -- --  HGB 9.5* 9.0* 7.6* 6.7* 4.2*  HCT 28.0* 26.3* 23.4* 20.2* 13.6*  MCV 86.2 87.4 -- 87.1 92.5  PLT 139* 139* -- 133* 133*   Cardiac Enzymes: No results found for this basename: CKTOTAL:5,CKMB:5,CKMBINDEX:5,TROPONINI:5 in the last 168 hours BNP (last 3 results) No results found for this basename: PROBNP:3 in the last 8760 hours CBG:  Lab 09/04/12 1123 09/04/12 0731 09/03/12  2026 09/03/12 1642 09/03/12 1142  GLUCAP 228* 235* 237* 208* 287*    No results found for this or any previous visit (from the past 240 hour(s)).   Studies: No results found.  Scheduled Meds:    . calcium acetate  2,668 mg Oral TID WC  . carvedilol  3.125 mg Oral BID WC  . darbepoetin (ARANESP) injection - DIALYSIS  100 mcg Subcutaneous Q Wed-HD  . enoxaparin (LOVENOX) injection  30 mg Subcutaneous q1800  . famotidine  20 mg Oral Daily  . fosamprenavir  1,400 mg Oral BID WC  . furosemide  40 mg Oral Daily  . gabapentin  100 mg Oral Daily  . insulin aspart  0-15 Units Subcutaneous TID WC  . insulin aspart  0-5 Units Subcutaneous QHS  . insulin aspart  5 Units Subcutaneous TID WC  . insulin glargine  14 Units Subcutaneous BH-q7a  . latanoprost  1 drop Both Eyes QHS  . metoCLOPramide  5 mg Oral TID AC  . pravastatin  20 mg Oral q1800  . predniSONE  40 mg Oral QAC breakfast  . raltegravir  400 mg Oral BID  . ritonavir  100 mg Oral BID WC  . sevelamer  800 mg Oral TID WC  . sodium chloride  3 mL Intravenous Q12H  . sodium chloride  3 mL Intravenous Q12H  . sulfamethoxazole-trimethoprim  1 tablet Oral Daily  . tenofovir  300 mg Oral Q7 days  . [DISCONTINUED] calcium acetate  667 mg Oral TID WC  . [DISCONTINUED] fosamprenavir  1,400 mg Oral BID  . [DISCONTINUED] fosamprenavir  1,400 mg Oral BID WC  . [DISCONTINUED] loratadine  10 mg Oral Daily  . [DISCONTINUED] methocarbamol  750 mg Oral QID  . [DISCONTINUED] pantoprazole  40 mg Oral Daily  . [DISCONTINUED] polyvinyl alcohol  1 drop Both Eyes QID  . [DISCONTINUED] ritonavir  100 mg Oral BID  . [DISCONTINUED] simvastatin  10 mg Oral q1800  . [DISCONTINUED] sulfamethoxazole-trimethoprim  1 tablet Oral Daily   Continuous Infusions:    . dialysis solution 1.5% low-MG/low-CA 5 L (09/02/12 2225)  . dialysis solution 2.5% low-MG/low-CA      Principal Problem:  *Anemia Active Problems:  ESRD on peritoneal dialysis  HIV  disease  Hypertension  DM type 2 causing renal disease  Sciatica    Time spent: > 35 mins    [facility]  [facility] Pager [phone]. If 8PM-8AM, please contact night-coverage at [email or web address], password [facility] 09/04/2012, 3:26 PM  LOS: 2 days

## 2012-09-04 NOTE — Consult Note (Addendum)
[facility]  Total days of antibiotics 1        Day 1 bactrim DS daily        Day 1 of cART: raltegravir, fos/r, truvada Reason for Consult: HIV    Referring Physician: Janee Morn  Principal Problem:  *Anemia Active Problems:  ESRD on peritoneal dialysis  HIV disease  Hypertension  DM type 2 causing renal disease  Sciatica    HPI: Shawon Denzer is a 66 y.o. male Savien Mamula is a 66 y.o. male with HIV disease, Cd 4 count 160(16%) / VL pending, on raltegravir/tenofovir/boosted fosampranavir,c/b anal cancer s/p chemo 2012,  history of ESRD on peritoneal dialysis, DM2, HTN, followed at [facility] by Dr. Aileen Fass (from ID) who presents with fatigue, dizziness and loss of energy x 2 wks. He sustained a ground level fall roughly 1 wk ago, scraped his knees due to weakness.his  labs revealed severe anemia with hemoglobin of 4.8 and hematocrit of 15. He has history of transfusion dependent anemia required transfusion 2 times in the past year-.  He denies chest pain, SOB, hemoptysis, N/V, hematemesis, melena and no hematochezia, no bloody stools. He has received several RBC transfusion for which his hgb went from 4.2 to 9.5.  He states that his CD 4 count roughly stays in the 200s and his viral load is undetectable. He was last seen by his HIV doc in March 2013. He takes his meds routinely and consistently   Past Medical History  Diagnosis Date  . ESRD on peritoneal dialysis   . HIV disease   . Hypertension   . Shortness of breath   . DM type 2 causing renal disease   . Neuromuscular disorder     tremers  . Cancer     hx of rectal cancer  . Anemia 09/02/2012  . Hepatitis     hx of hepatitis B    Allergies:  Allergies  Allergen Reactions  . Codeine Nausea Only    MEDICATIONS:    . calcium acetate  2,668 mg Oral TID WC  . carvedilol  3.125 mg Oral BID WC  . darbepoetin (ARANESP) injection - DIALYSIS  100 mcg Subcutaneous Q Wed-HD  .  enoxaparin (LOVENOX) injection  30 mg Subcutaneous q1800  . famotidine  20 mg Oral Daily  . fosamprenavir  1,400 mg Oral BID WC  . furosemide  40 mg Oral Daily  . gabapentin  100 mg Oral Daily  . insulin aspart  0-15 Units Subcutaneous TID WC  . insulin aspart  0-5 Units Subcutaneous QHS  . insulin aspart  5 Units Subcutaneous TID WC  . insulin glargine  16 Units Subcutaneous BH-q7a  . latanoprost  1 drop Both Eyes QHS  . metoCLOPramide  5 mg Oral TID AC  . pravastatin  20 mg Oral q1800  . predniSONE  40 mg Oral QAC breakfast  . raltegravir  400 mg Oral BID  . ritonavir  100 mg Oral BID WC  . sevelamer  800 mg Oral TID WC  . sodium chloride  3 mL Intravenous Q12H  . sodium chloride  3 mL Intravenous Q12H  . sulfamethoxazole-trimethoprim  1 tablet Oral Daily  . tenofovir  300 mg Oral Q7 days  . [DISCONTINUED] calcium acetate  667 mg Oral TID WC  . [DISCONTINUED] fosamprenavir  1,400 mg Oral BID  . [DISCONTINUED] fosamprenavir  1,400 mg Oral BID WC  . [DISCONTINUED] insulin glargine  14 Units Subcutaneous BH-q7a  . [DISCONTINUED]  pantoprazole  40 mg Oral Daily  . [DISCONTINUED] ritonavir  100 mg Oral BID  . [DISCONTINUED] simvastatin  10 mg Oral q1800  . [DISCONTINUED] sulfamethoxazole-trimethoprim  1 tablet Oral Daily    History  Substance Use Topics  . Smoking status: Current Every Day Smoker -- 0.2 packs/day for 50 years    Types: Cigarettes  . Smokeless tobacco: Never Used  . Alcohol Use: No    History reviewed. No pertinent family history.  Review of Systems -  10 point reviewed adn negative, other than what is mentioned in hpi, .    OBJECTIVE: Temp:  [98.4 F (36.9 C)-98.9 F (37.2 C)] 98.7 F (37.1 C) (11/07 2049) Pulse Rate:  [66-73] 73  (11/07 2049) Resp:  [16-20] 18  (11/07 2049) BP: (116-128)/(55-69) 116/55 mmHg (11/07 2049) SpO2:  [96 %-100 %] 100 % (11/07 2049) Weight:  [227 lb 11.8 oz (103.3 kg)] 227 lb 11.8 oz (103.3 kg) (11/07 2049) BP 116/55   Pulse 73  Temp 98.7 F (37.1 C) (Oral)  Resp 18  Ht 5\' 11"  (1.803 m)  Wt 227 lb 11.8 oz (103.3 kg)  BMI 31.76 kg/m2  SpO2 100%  General Appearance:    Alert, cooperative, no distress, appears stated age  Head:    Normocephalic, without obvious abnormality, atraumatic  Eyes:    PERRL, conjunctiva/corneas clear, EOM's intact, pale conjunctiva     Nose:   Nares normal, septum midline, mucosa normal, no drainage   or sinus tenderness  Throat:   Lips, mucosa, and tongue normal; teeth and gums normal  Neck:   Supple, symmetrical, trachea midline, no adenopathy;         Back:     Symmetric, no curvature, ROM normal, no CVA tenderness  Lungs:     Clear to auscultation bilaterally, respirations unlabored  Chest wall:    No tenderness or deformity  Heart:    Regular rate and rhythm, S1 and S2 normal, no murmur, rub   or gallop  Abdomen:     Soft, non-tender, bowel sounds active all four quadrants,    no masses, no organomegaly; PD present        Extremities:   Extremities normal, atraumatic, no cyanosis or edema  Pulses:   2+ and symmetric all extremities  Skin:   Skin color, texture, turgor normal, no rashes or lesions  Lymph nodes:   Cervical, supraclavicular, and axillary nodes normal  Neurologic:   CNII-XII intact. Normal strength, sensation and reflexes      throughout   LABS: Results for orders placed during the hospital encounter of 09/02/12 (from the past 48 hour(s))  GLUCOSE, CAPILLARY     Status: Abnormal   Collection Time   09/02/12  9:51 PM      Component Value Range Comment   Glucose-Capillary 241 (*) 70 - 99 mg/dL   CBC     Status: Abnormal   Collection Time   09/03/12  5:15 AM      Component Value Range Comment   WBC 4.9  4.0 - 10.5 K/uL    RBC 2.32 (*) 4.22 - 5.81 MIL/uL    Hemoglobin 6.7 (*) 13.0 - 17.0 g/dL    HCT 40.9 (*) 81.1 - 52.0 %    MCV 87.1  78.0 - 100.0 fL    MCH 28.9  26.0 - 34.0 pg    MCHC 33.2  30.0 - 36.0 g/dL    RDW 91.4  78.2 - 95.6 %     Platelets 133 (*)  150 - 400 K/uL   GLUCOSE, CAPILLARY     Status: Abnormal   Collection Time   09/03/12  7:41 AM      Component Value Range Comment   Glucose-Capillary 163 (*) 70 - 99 mg/dL    Comment 1 Documented in Chart      Comment 2 Notify RN     PREPARE RBC (CROSSMATCH)     Status: Normal   Collection Time   09/03/12  8:24 AM      Component Value Range Comment   Order Confirmation ORDER PROCESSED BY BLOOD BANK     HEMOGLOBIN AND HEMATOCRIT, BLOOD     Status: Abnormal   Collection Time   09/03/12  9:23 AM      Component Value Range Comment   Hemoglobin 7.6 (*) 13.0 - 17.0 g/dL    HCT 16.1 (*) 09.6 - 52.0 %   COMPREHENSIVE METABOLIC PANEL     Status: Abnormal   Collection Time   09/03/12  9:23 AM      Component Value Range Comment   Sodium 133 (*) 135 - 145 mEq/L    Potassium 4.3  3.5 - 5.1 mEq/L    Chloride 94 (*) 96 - 112 mEq/L    CO2 24  19 - 32 mEq/L    Glucose, Bld 289 (*) 70 - 99 mg/dL    BUN 80 (*) 6 - 23 mg/dL    Creatinine, Ser 04.54 (*) 0.50 - 1.35 mg/dL    Calcium 8.0 (*) 8.4 - 10.5 mg/dL    Total Protein 7.1  6.0 - 8.3 g/dL    Albumin 2.5 (*) 3.5 - 5.2 g/dL    AST 17  0 - 37 U/L    ALT 12  0 - 53 U/L    Alkaline Phosphatase 108  39 - 117 U/L    Total Bilirubin 0.4  0.3 - 1.2 mg/dL    GFR calc non Af Amer 4 (*) >90 mL/min    GFR calc Af Amer 5 (*) >90 mL/min   GLUCOSE, CAPILLARY     Status: Abnormal   Collection Time   09/03/12 11:42 AM      Component Value Range Comment   Glucose-Capillary 287 (*) 70 - 99 mg/dL    Comment 1 Documented in Chart      Comment 2 Notify RN     GLUCOSE, CAPILLARY     Status: Abnormal   Collection Time   09/03/12  4:42 PM      Component Value Range Comment   Glucose-Capillary 208 (*) 70 - 99 mg/dL    Comment 1 Documented in Chart      Comment 2 Notify RN     GLUCOSE, CAPILLARY     Status: Abnormal   Collection Time   09/03/12  8:26 PM      Component Value Range Comment   Glucose-Capillary 237 (*) 70 - 99 mg/dL   CBC      Status: Abnormal   Collection Time   09/04/12 12:11 AM      Component Value Range Comment   WBC 5.6  4.0 - 10.5 K/uL    RBC 3.01 (*) 4.22 - 5.81 MIL/uL    Hemoglobin 9.0 (*) 13.0 - 17.0 g/dL    HCT 09.8 (*) 11.9 - 52.0 %    MCV 87.4  78.0 - 100.0 fL    MCH 29.9  26.0 - 34.0 pg    MCHC 34.2  30.0 - 36.0 g/dL  RDW 15.0  11.5 - 15.5 %    Platelets 139 (*) 150 - 400 K/uL   T-HELPER CELLS (CD4) COUNT     Status: Abnormal   Collection Time   09/04/12 12:11 AM      Component Value Range Comment   CD4 T Cell Abs 160 (*) 400 - 2700 cmm    CD4 % Helper T Cell 16 (*) 33 - 55 %   GLUCOSE, CAPILLARY     Status: Abnormal   Collection Time   09/04/12  7:31 AM      Component Value Range Comment   Glucose-Capillary 235 (*) 70 - 99 mg/dL   RENAL FUNCTION PANEL     Status: Abnormal   Collection Time   09/04/12  7:45 AM      Component Value Range Comment   Sodium 136  135 - 145 mEq/L    Potassium 4.7  3.5 - 5.1 mEq/L    Chloride 97  96 - 112 mEq/L    CO2 23  19 - 32 mEq/L    Glucose, Bld 237 (*) 70 - 99 mg/dL    BUN 81 (*) 6 - 23 mg/dL    Creatinine, Ser 16.10 (*) 0.50 - 1.35 mg/dL    Calcium 7.4 (*) 8.4 - 10.5 mg/dL    Phosphorus 6.6 (*) 2.3 - 4.6 mg/dL    Albumin 2.4 (*) 3.5 - 5.2 g/dL    GFR calc non Af Amer 4 (*) >90 mL/min    GFR calc Af Amer 5 (*) >90 mL/min   CBC     Status: Abnormal   Collection Time   09/04/12  7:45 AM      Component Value Range Comment   WBC 5.8  4.0 - 10.5 K/uL    RBC 3.25 (*) 4.22 - 5.81 MIL/uL    Hemoglobin 9.5 (*) 13.0 - 17.0 g/dL    HCT 96.0 (*) 45.4 - 52.0 %    MCV 86.2  78.0 - 100.0 fL    MCH 29.2  26.0 - 34.0 pg    MCHC 33.9  30.0 - 36.0 g/dL    RDW 09.8  11.9 - 14.7 %    Platelets 139 (*) 150 - 400 K/uL   GLUCOSE, CAPILLARY     Status: Abnormal   Collection Time   09/04/12 11:23 AM      Component Value Range Comment   Glucose-Capillary 228 (*) 70 - 99 mg/dL   GLUCOSE, CAPILLARY     Status: Abnormal   Collection Time   09/04/12  4:15 PM       Component Value Range Comment   Glucose-Capillary 169 (*) 70 - 99 mg/dL     MICRO: none IMAGING: No results found.  Assessment/Plan:  66 yo Male with HIV disease, CD 4 count of 160, VL pending, on raltegravir/tenofovir/fosamprenavir c/b anal cancer in 2012, ESRD on PD,  presents with severe anemia  - please keep him on his home HIV regimen : raltegravir 400mg  BID, fosamprenavir  700mg  BID plus ritonavir 100mg  BID, then weekly tenofovir (after HD)  - he has pending viral load which we will forward to him and his HIV providers at [facility]  - His CD 4 count < 200, thus would recommend to continue with OI proph with bactrim DS daily. He can have it discontinued once his providers repeat his labs  - health maintenance - will check to see if he needs flu vaccination  -severe anemia - corrected with transfusions  - dispo =  recommend that he sees his HIV providers at [facility] in Nov/Dec  Will sign off.  Duke Salvia Drue Second MD MPH [facility] [phone]

## 2012-09-04 NOTE — Progress Notes (Signed)
Occupational Therapy Treatment Patient Details Name: Benjamin Long MRN: [id number] DOB: [birth year] Today's Date: 09/04/2012 Time: 1415-1440 OT Time Calculation (min): 25 min  OT Assessment / Plan / Recommendation Comments on Treatment Session Pt with increased weakness today and new c/o RLE weakness and pain. Pt completed functional mobilty with RW today due to balance deficit and increased pain shooting down R leg. Due to new onset of RLE weakness and pain, pt is not appropriate for D/C today. Pt may e able to D/C home tomorrow afternoon with partner and will have 24/7 S at that time. Discussed need for pt to have 24/7 S at this time. Pt will need 24/7 S, HHOT/PT, 3 in 1 BSC and RW for safe D/C home when medically ready.    Follow Up Recommendations  Home health OT    Barriers to Discharge   ? Caregiver support    Equipment Recommendations  Rolling walker with 5" wheels;3 in 1 bedside comode    Recommendations for Other Services    Frequency Min 3X/week   Plan Discharge plan remains appropriate    Precautions / Restrictions Precautions Precautions: Fall Precaution Comments: new onset R LE pain/weakness   Pertinent Vitals/Pain C/o R leg pain. Did not rate.    ADL  Upper Body Dressing: Minimal assistance;Supervision/safety (for balance) Toilet Transfer: Min guard Toilet Transfer Method: Sit to stand;Stand pivot Toilet Transfer Equipment: Bedside commode Toileting - Clothing Manipulation and Hygiene: Supervision/safety Where Assessed - Engineer, mining and Hygiene: Standing Equipment Used: Gait belt;Rolling walker Transfers/Ambulation Related to ADLs: Min guard. Increased problems with balance today. ADL Comments: limited by wekness/ buckling of knees    OT Diagnosis:    OT Problem List:   OT Treatment Interventions:     OT Goals Acute Rehab OT Goals OT Goal Formulation: With patient Time For Goal Achievement: 09/10/12 Potential to Achieve Goals:  Good ADL Goals Pt Will Perform Lower Body Bathing: with modified independence;Sit to stand from bed;Unsupported ADL Goal: Lower Body Bathing - Progress: Progressing toward goals Pt Will Perform Lower Body Dressing: with modified independence;Sit to stand from bed;Unsupported ADL Goal: Lower Body Dressing - Progress: Progressing toward goals Pt Will Transfer to Toilet: with modified independence;Ambulation;with DME;3-in-1 ADL Goal: Toilet Transfer - Progress: Progressing toward goals Pt Will Perform Toileting - Clothing Manipulation: with modified independence;Sitting on 3-in-1 or toilet;Standing ADL Goal: Toileting - Clothing Manipulation - Progress: Progressing toward goals Pt Will Perform Toileting - Hygiene: Independently;Standing at 3-in-1/toilet ADL Goal: Toileting - Hygiene - Progress: Progressing toward goals Pt Will Perform Tub/Shower Transfer: Tub transfer;with supervision;with caregiver independent in assisting;with DME Additional ADL Goal #1: Pt will complete functional mobility within room @ RW level @ mod I level during ADL task. ADL Goal: Additional Goal #1 - Progress: Progressing toward goals  Visit Information  Last OT Received On: 09/04/12 Assistance Needed: +1    Subjective Data      Prior Functioning       Cognition  Overall Cognitive Status: Appears within functional limits for tasks assessed/performed Arousal/Alertness: Awake/alert Orientation Level: Appears intact for tasks assessed Behavior During Session: [facility] for tasks performed    Mobility  Shoulder Instructions Bed Mobility Bed Mobility: Supine to Sit Supine to Sit: 6: Modified independent (Device/Increase time) Sitting - Scoot to Edge of Bed: 6: Modified independent (Device/Increase time) Transfers Transfers: Sit to Stand;Stand to Sit Sit to Stand: 4: Min guard;With upper extremity assist;From bed Stand to Sit: 4: Min guard;With upper extremity assist;To chair/3-in-1 Details for Transfer  Assistance:  vc for safe technique       Exercises      Balance  Min A   End of Session OT - End of Session Equipment Utilized During Treatment: Gait belt Activity Tolerance: Patient limited by fatigue Patient left: in bed;with call bell/phone within reach;Other (comment) (with PT) Nurse Communication: Other (comment) (pt not ready for D/C today)  GO     Nicolis Boody,HILLARY 09/04/2012, 3:08 PM [facility], OTR/L  [phone] 09/04/2012

## 2012-09-05 LAB — BASIC METABOLIC PANEL
BUN: 81 mg/dL — ABNORMAL HIGH (ref 6–23)
Chloride: 99 mEq/L (ref 96–112)
Creatinine, Ser: 10.47 mg/dL — ABNORMAL HIGH (ref 0.50–1.35)
GFR calc Af Amer: 5 mL/min — ABNORMAL LOW (ref >90)
Glucose, Bld: 202 mg/dL — ABNORMAL HIGH (ref 70–99)
Potassium: 4.5 mEq/L (ref 3.5–5.1)

## 2012-09-05 LAB — GLUCOSE, CAPILLARY
Glucose-Capillary: 184 mg/dL — ABNORMAL HIGH (ref 70–99)
Glucose-Capillary: 254 mg/dL — ABNORMAL HIGH (ref 70–99)

## 2012-09-05 LAB — CBC
HCT: 28.1 % — ABNORMAL LOW (ref 39.0–52.0)
Hemoglobin: 9.4 g/dL — ABNORMAL LOW (ref 13.0–17.0)
MCH: 29.6 pg (ref 26.0–34.0)
MCHC: 33.5 g/dL (ref 30.0–36.0)
MCV: 88.4 fL (ref 78.0–100.0)
RDW: 14.8 % (ref 11.5–15.5)

## 2012-09-05 MED ORDER — PREDNISONE 20 MG PO TABS: 40.0000 mg | ORAL_TABLET | Freq: Every day | ORAL | Status: DC

## 2012-09-05 MED ORDER — PRAVASTATIN SODIUM 20 MG PO TABS: 20.0000 mg | ORAL_TABLET | Freq: Every day | ORAL | Status: DC

## 2012-09-05 MED ORDER — INSULIN GLARGINE 100 UNIT/ML ~~LOC~~ SOLN: 20.0000 [IU] | Freq: Every day | SUBCUTANEOUS | Status: DC

## 2012-09-05 MED ORDER — SULFAMETHOXAZOLE-TRIMETHOPRIM 400-80 MG PO TABS: 1.0000 | ORAL_TABLET | Freq: Every day | ORAL | Status: DC

## 2012-09-05 MED ORDER — FOSAMPRENAVIR CALCIUM 700 MG PO TABS: 700.0000 mg | ORAL_TABLET | Freq: Two times a day (BID) | ORAL | Status: DC

## 2012-09-05 NOTE — Discharge Summary (Signed)
Physician Discharge Summary  Benjamin Long ZOX:[id number] DOB: [birth year] DOA: 09/02/2012  PCP: Evlyn Courier, MD  Admit date: 09/02/2012 Discharge date: 09/05/2012  Time spent: 60 minutes  Recommendations for Outpatient Follow-up:  1. Patient is to follow up with his PCP one week post discharge. On follow up patient will need a CBC done to follow up on his hemoglobin. Patient will also need a basic metabolic profile done to follow up on his electrolytes and renal function.patient viral load will need to be followed up upon 2. Patient is to follow up with his infectious disease doctor one week post discharge. On follow up viral load would need to be followed up upon as this was pending at the time of discharge. 3. Patient is to follow up with his hematologist as scheduled. Further workup of his anemia will be deferred to his hematologist.  Discharge Diagnoses:  Principal Problem:  *Iron (Fe) deficiency anemia Active Problems:  ESRD on peritoneal dialysis  HIV disease  Hypertension  DM type 2 causing renal disease  Sciatica   Discharge Condition: stable and improved  Diet recommendation: renal diet/diabetic diet/heart healthy diet  Filed Weights   09/02/12 2001 09/03/12 2023 09/04/12 2049  Weight: 101.379 kg (223 lb 8 oz) 102.1 kg (225 lb 1.4 oz) 103.3 kg (227 lb 11.8 oz)    History of present illness:  Benjamin Long is a 66 y.o. male with history of ESRD on peritoneal dialysis per renal at [facility], HIV, Anal cancer-s/p chemo a year ago, diabetes mellitus type 2 and and HTN who presents with the above complaints. He states he was seen on the day prior to admission at Dr [facility] with complaints of fatigue, dizziness and loss of energy and lab work was done and came back today with hemoglobin is 4.8 and his hematocrit is 15. [facility] was called for direct admission for further eval and management. He states that he has had this problem in the past and required  transfusion 2 times in the past year- last in Feb or March of this year. He denies chest pain, SOB, hemoptysis, N/V, hematemesis, melena and no hematochezia. He admits to fall about I week ago, denies loss of consciousness.   Hospital Course:  #1 severe symptomatic iron deficiency anemia  Patient was admitted as a direct admit with complaints of fatigue dizziness loss of energy and lab work obtained her PCPs office did show a hemoglobin of 4.8. Patient was admitted to the telemetry floor and had no signs of overt GI bleed. Anemia panel which was done did have an iron level less than 10 which was consistent with a severe iron deficiency anemia. Patient was transfused a total of 5 units of packed red blood cells with significant improvement in his hemoglobin to a level of 9.4 on day of discharge.patient improved clinically. Patient also received a dose of IV and nebs during this hospitalization secondary to his end-stage renal disease. Patient did state that he had a prior history of anemia which was being followed by hematologist as outpatient and was told his anemia was likely secondary to end-stage renal disease. Patient's hemoglobin stabilized during the hospitalization and will be discharged in stable and improved condition to followup with his hematologist and primary care physician as outpatient. Patient be discharged in stable and improved condition.  #2 end-stage renal disease on peritoneal dialysis  During the hospitalization patient was seen by nephrology secondary to his history of end-stage renal disease on peritoneal dialysis. Patient did receive a  dose of aranesp during this hospitalization. Patient also received is peritoneal dialysis while in the hospital and was euvolemic and remained stable. Patient was followed per nephrology and was deemed stable per nephrology for discharge home. Patient is to follow up with his renal doctors at the [facility] as outpatient. #3 HIV/AIDS  On admission to  the hospital patient did have a history of HIV disease. He was started back on his anti-retroviral medications. His CD4 count was obtained which came back at 160. Her viral load was also obtained however was pending at the time of discharge. Patient was started on Bactrim for PCP prophylaxis during this hospitalization. An ID consultation was also obtained and patient was seen in consultation by Dr. Judyann Munson of infectious diseases. It was recommended per infectious disease to keep patient on his home HIV regimen, continue Bactrim DS daily for prophylaxis and for patient to follow up with his HIV providers at the [facility]. Patient's HIV providers will need to follow up on viral load was pending at the time of discharge. Patient remained in stable condition throughout the hospitalization. #4 probable sciatica In the hospitalization patient did complain of pain shooting down his right leg. It was felt this was likely secondary to sciatica. Patient was hesitant to be placed on NSAIDs and a such was started on a quick steroid taper with clinical improvement. Patient will followup with his PCP as outpatient. #5 status post fall/weakness  Patient had presented with generalized weakness had a fall prior to admission which was felt to be secondary to symptomatic severe iron deficiency anemia. Patient was transfused a total of 5 units of packed red blood cells with clinical improvement. Patient was seen by physical therapy during this hospitalization and home health therapy was recommended.  The rest of patient's chronic medical issues remained stable throughout the hospitalization and patient be discharged in stable and improved condition   Procedures: Chest x-ray 09/02/2012  3 units of packed red blood cells 09/02/2012  2 units packed red blood cells 09/03/2012 Peritoneal dialysis during this hospitalization  Consultations: Nephrology: Dr. Hyman Hopes 09/02/2012 Infectious diseases: Dr. Jerolyn Center  09/04/2012  Discharge Exam: Filed Vitals:   09/04/12 1844 09/04/12 2049 09/05/12 0502 09/05/12 1000  BP: 125/69 116/55 126/75 121/59  Pulse: 72 73 66 62  Temp: 98.5 F (36.9 C) 98.7 F (37.1 C) 98.5 F (36.9 C) 97.9 F (36.6 C)  TempSrc: Oral Oral Oral Oral  Resp: 20 18 18 20   Height:      Weight:  103.3 kg (227 lb 11.8 oz)    SpO2: 99% 100% 100% 100%    General: NAD Cardiovascular: RRR. No JVD. No LE edema Respiratory: CTAB  Discharge Instructions      Discharge Orders    Future Orders Please Complete By Expires   Diet - low sodium heart healthy      Comments:   Renal diet/diabetic diet   Increase activity slowly      Discharge instructions      Comments:   Follow up with HILL,GERALD K, MD in 1 week Follow up with ID doctor in 2 weeks Follow up with hematologist in 1-2 weeks.       Medication List     As of 09/07/2012  5:10 PM    STOP taking these medications         methocarbamol 750 MG tablet   Commonly known as: ROBAXIN      simethicone 80 MG chewable tablet   Commonly known  as: MYLICON      TAKE these medications         acetaminophen 325 MG tablet   Commonly known as: TYLENOL   Take 650 mg by mouth 2 (two) times daily as needed. As needed for pain or fever.      allopurinol 100 MG tablet   Commonly known as: ZYLOPRIM   Take 100 mg by mouth daily.      aspirin EC 81 MG tablet   Take 81 mg by mouth daily.      calcium acetate 667 MG capsule   Commonly known as: PHOSLO   Take 2,668 mg by mouth 3 (three) times daily with meals.      docusate sodium 100 MG capsule   Commonly known as: COLACE   Take 100 mg by mouth 2 (two) times daily.      ferrous sulfate 325 (65 FE) MG tablet   Take 325 mg by mouth daily with breakfast.      fosamprenavir 700 MG tablet   Commonly known as: LEXIVA   Take 1 tablet (700 mg total) by mouth 2 (two) times daily with a meal.      furosemide 40 MG tablet   Commonly known as: LASIX   Take 40 mg by mouth  daily.      gabapentin 100 MG capsule   Commonly known as: NEURONTIN   Take 100 mg by mouth daily.      insulin aspart 100 UNIT/ML injection   Commonly known as: novoLOG   Inject 2-8 Units into the skin 3 (three) times daily before meals. Per sliding scale      insulin glargine 100 UNIT/ML injection   Commonly known as: LANTUS   Inject 20 Units into the skin at bedtime.      insulin NPH 100 UNIT/ML injection   Commonly known as: HUMULIN N,NOVOLIN N   Inject 4-8 Units into the skin 3 (three) times daily. Per sliding scale      latanoprost 0.005 % ophthalmic solution   Commonly known as: XALATAN   Place 1 drop into both eyes at bedtime.      loratadine 10 MG tablet   Commonly known as: CLARITIN   Take 10 mg by mouth daily.      metoCLOPramide 5 MG tablet   Commonly known as: REGLAN   Take 5 mg by mouth 3 (three) times daily.      multivitamin Tabs tablet   Take 1 tablet by mouth daily.      polyvinyl alcohol 1.4 % ophthalmic solution   Commonly known as: LIQUIFILM TEARS   Place 1 drop into both eyes 4 (four) times daily.      pravastatin 20 MG tablet   Commonly known as: PRAVACHOL   Take 1 tablet (20 mg total) by mouth daily at 6 PM.      predniSONE 20 MG tablet   Commonly known as: DELTASONE   Take 2 tablets (40 mg total) by mouth daily before breakfast. Take for 4 days then stop.      raltegravir 400 MG tablet   Commonly known as: ISENTRESS   Take 400 mg by mouth 2 (two) times daily.      ritonavir 100 MG capsule   Commonly known as: NORVIR   Take 100 mg by mouth 2 (two) times daily.      sildenafil 50 MG tablet   Commonly known as: VIAGRA   Take 50 mg by mouth daily as needed. For erectile  dysfunction      sulfamethoxazole-trimethoprim 400-80 MG per tablet   Commonly known as: BACTRIM,SEPTRA   Take 1 tablet by mouth daily.      tenofovir 300 MG tablet   Commonly known as: VIREAD   Take 300 mg by mouth every 7 (seven) days. Take on Tuesdays after  hemodialysis.        Follow-up Information    Follow up with [facility] K, MD. Schedule an appointment as soon as possible for a visit in 1 week.   Contact information:   [address] [place] [address] [phone]       Schedule an appointment as soon as possible for a visit in 2 weeks to follow up. (f/u with ID doctor in 2 weeks)       Schedule an appointment as soon as possible for a visit in 1 week to follow up. (f/u with hematologist in 1-2 weeks)           The results of significant diagnostics from this hospitalization (including imaging, microbiology, ancillary and laboratory) are listed below for reference.    Significant Diagnostic Studies: Portable Chest 1 View  09/02/2012  *RADIOLOGY REPORT*  Clinical Data: Cough and shortness of breath.  PORTABLE CHEST - 1 VIEW  Comparison: 02/14/2010  Findings: Heart size and pulmonary vascularity are normal.  There is slight accentuation of the interstitial markings in the left perihilar region without a consolidative infiltrate or effusion.  Right lung is clear.  No acute osseous abnormality.  Previous resection of the distal clavicle.  Moderate arthritis the left AC joint.  IMPRESSION: Bronchitic changes of the left perihilar region which may be chronic.   Original Report Authenticated By: Francene Boyers, M.D.     Microbiology: No results found for this or any previous visit (from the past 240 hour(s)).   Labs: Basic Metabolic Panel:  Lab 09/05/12 2130 09/04/12 0745 09/03/12 0923 09/02/12 1518  NA 135 136 133* 134*  K 4.5 4.7 4.3 5.1  CL 99 97 94* 97  CO2 23 23 24 25   GLUCOSE 202* 237* 289* 433*  BUN 81* 81* 80* 88*  CREATININE 10.47* 11.04* 11.09* 12.27*  CALCIUM 7.4* 7.4* 8.0* 8.2*  MG -- -- -- --  PHOS -- 6.6* -- --   Liver Function Tests:  Lab 09/04/12 0745 09/03/12 0923  AST -- 17  ALT -- 12  ALKPHOS -- 108  BILITOT -- 0.4  PROT -- 7.1  ALBUMIN 2.4* 2.5*   No results found for this  basename: LIPASE:5,AMYLASE:5 in the last 168 hours No results found for this basename: AMMONIA:5 in the last 168 hours CBC:  Lab 09/05/12 0535 09/04/12 0745 09/04/12 0011 09/03/12 0923 09/03/12 0515 09/02/12 1518  WBC 5.7 5.8 5.6 -- 4.9 5.0  NEUTROABS -- -- -- -- -- --  HGB 9.4* 9.5* 9.0* 7.6* 6.7* --  HCT 28.1* 28.0* 26.3* 23.4* 20.2* --  MCV 88.4 86.2 87.4 -- 87.1 92.5  PLT 160 139* 139* -- 133* 133*   Cardiac Enzymes: No results found for this basename: CKTOTAL:5,CKMB:5,CKMBINDEX:5,TROPONINI:5 in the last 168 hours BNP: BNP (last 3 results) No results found for this basename: PROBNP:3 in the last 8760 hours CBG:  Lab 09/05/12 1641 09/05/12 1204 09/05/12 0752 09/04/12 2153 09/04/12 1615  GLUCAP 254* 184* 167* 202* 169*       Signed:  Rudy Domek  [facility] 09/05/2012, 5:10 PM

## 2012-09-05 NOTE — Progress Notes (Signed)
Physical Therapy Treatment Patient Details Name: Benjamin Long MRN: [id number] DOB: 1946-08-19 Today's Date: 09/05/2012 Time: 4098-1191 PT Time Calculation (min): 28 min  PT Assessment / Plan / Recommendation Comments on Treatment Session  Pt. reports that his hip and thigh pain is much better today, and it did not interfere with his transition from stand to sit today.  His gait is more stable today with use of RW, and he could tolerate exercises that were designed to fatigue right LE in standing and walking without knee buckling today.  He appears ready for DC from PT standpoint, though it is recommended that he continue to use RW at home until cleared by HHPT.  Do recommend HHPT and RW for home use.    Follow Up Recommendations  Home health PT     Does the patient have the potential to tolerate intense rehabilitation     Barriers to Discharge        Equipment Recommendations  Rolling walker with 5" wheels;3 in 1 bedside comode    Recommendations for Other Services    Frequency Min 3X/week   Plan Discharge plan remains appropriate    Precautions / Restrictions Precautions Precautions: Fall Precaution Comments: new onset R LE pain/weakness Restrictions Weight Bearing Restrictions: No   Pertinent Vitals/Pain No pain in right thigh/hip this am    Mobility  Bed Mobility Bed Mobility: Not assessed Transfers Transfers: Sit to Stand;Stand to Sit Sit to Stand: 6: Modified independent (Device/Increase time);From bed;With upper extremity assist Stand to Sit: 4: Min guard;With armrests;To chair/3-in-1 Details for Transfer Assistance: Needed min guard assist for safety in sitting.  Yesterday he had a great deal of pain in right thigh when he sat down, but today he reports he did not notice pain at all in this task. Ambulation/Gait Ambulation/Gait Assistance: 5: Supervision Ambulation Distance (Feet): 60 Feet Assistive device: Rolling walker Ambulation/Gait Assistance Details:  Pt. needed supervision for safety today but did not have any knee buckling today while mobilizing.  Therapist managed pt's PD tubing so pt's hands free to use RW. Gait Pattern: Step-through pattern;Trunk flexed Gait velocity: decr    Exercises     PT Diagnosis:    PT Problem List:   PT Treatment Interventions:     PT Goals Acute Rehab PT Goals PT Goal: Sit to Stand - Progress: Progressing toward goal PT Goal: Stand to Sit - Progress: Goal set today PT Goal: Ambulate - Progress: Progressing toward goal  Visit Information  Last PT Received On: 09/05/12 Assistance Needed: +1    Subjective Data  Subjective: "I didn't feel pain today (in right thigh when he sat down in recliner)   Cognition  Overall Cognitive Status: Appears within functional limits for tasks assessed/performed Arousal/Alertness: Awake/alert Orientation Level: Appears intact for tasks assessed Behavior During Session: [facility] for tasks performed    Balance  Balance Balance Assessed: Yes Static Standing Balance Static Standing - Balance Support: No upper extremity supported Static Standing - Level of Assistance: 6: Modified independent (Device/Increase time);Other (comment) (with RW close at hand but not always used for support) Single Leg Stance - Right Leg: 5  Single Leg Stance - Left Leg: 6  High Level Balance High Level Balance Activites: Other (comment) (shallow knee bends, unilaterally with both legs x 10 reps)  End of Session PT - End of Session Equipment Utilized During Treatment: Gait belt Activity Tolerance: Patient tolerated treatment well Patient left: in chair;with call bell/phone within reach;with family/visitor present Nurse Communication: Mobility status  GP     Ferman Hamming 09/05/2012, 9:12 AM Weldon Picking PT Acute Rehab Services [phone] [place] [phone]

## 2012-09-05 NOTE — Progress Notes (Signed)
   CARE MANAGEMENT NOTE 09/05/2012  Patient:  Benjamin Long, Benjamin Long   Account Number:  [id number]  Date Initiated:  09/03/2012  Documentation initiated by:  Darlyne Russian  Subjective/Objective Assessment:   Patient admitted with Hgb 4.2, severe anemia.     Action/Plan:   Progression of care and discharge planning   Anticipated DC Date:  09/05/2012   Anticipated DC Plan:  HOME W HOME HEALTH SERVICES         [facility] Choice  HOME HEALTH   Choice offered to / List presented to:  C-1 Patient   DME arranged  WALKER - ROLLING      DME agency  [facility]     HH arranged  HH-2 PT  HH-3 OT      [place] agency  [facility]   Status of service:  In process, will continue to follow Medicare Important Message given?   (If response is "NO", the following Medicare IM given date fields will be blank) Date Medicare IM given:   Date Additional Medicare IM given:    Discharge Disposition:  HOME W HOME HEALTH SERVICES  Per UR Regulation:  Reviewed for med. necessity/level of care/duration of stay  If discussed at Long Length of Stay Meetings, dates discussed:    Comments:

## 2012-09-05 NOTE — Progress Notes (Signed)
Pt discharge instructions and medications reviewed - understanding verbalized. Ambulating with walker provided by home health agency. Condition stable upon departure.

## 2012-09-05 NOTE — Progress Notes (Signed)
[facility] ROUNDING NOTE   Subjective:   Interval History: leg pain better  Objective:  Vital signs in last 24 hours:  Temp:  [98.4 F (36.9 C)-98.7 F (37.1 C)] 98.5 F (36.9 C) (11/08 0502) Pulse Rate:  [66-73] 66  (11/08 0502) Resp:  [18-20] 18  (11/08 0502) BP: (116-128)/(55-75) 126/75 mmHg (11/08 0502) SpO2:  [99 %-100 %] 100 % (11/08 0502) Weight:  [103.3 kg (227 lb 11.8 oz)] 103.3 kg (227 lb 11.8 oz) (11/07 2049)  Weight change: 1.2 kg (2 lb 10.3 oz) Filed Weights   09/02/12 2001 09/03/12 2023 09/04/12 2049  Weight: 101.379 kg (223 lb 8 oz) 102.1 kg (225 lb 1.4 oz) 103.3 kg (227 lb 11.8 oz)    Intake/Output: I/O last 3 completed shifts: In: 264 [P.O.:264] Out: 500 [Urine:500]   Intake/Output this shift:     CVS- RRR  RS- CTA  ABD- BS present soft non-distended PD cath clean  EXT- no edema    Basic Metabolic Panel:  Lab 09/05/12 2956 09/04/12 0745 09/03/12 0923 09/02/12 1518  NA 135 136 133* 134*  K 4.5 4.7 4.3 5.1  CL 99 97 94* 97  CO2 23 23 24 25   GLUCOSE 202* 237* 289* 433*  BUN 81* 81* 80* 88*  CREATININE 10.47* 11.04* 11.09* 12.27*  CALCIUM 7.4* 7.4* 8.0* --  MG -- -- -- --  PHOS -- 6.6* -- --    Liver Function Tests:  Lab 09/04/12 0745 09/03/12 0923  AST -- 17  ALT -- 12  ALKPHOS -- 108  BILITOT -- 0.4  PROT -- 7.1  ALBUMIN 2.4* 2.5*   No results found for this basename: LIPASE:5,AMYLASE:5 in the last 168 hours No results found for this basename: AMMONIA:3 in the last 168 hours  CBC:  Lab 09/05/12 0535 09/04/12 0745 09/04/12 0011 09/03/12 0923 09/03/12 0515 09/02/12 1518  WBC 5.7 5.8 5.6 -- 4.9 5.0  NEUTROABS -- -- -- -- -- --  HGB 9.4* 9.5* 9.0* 7.6* 6.7* --  HCT 28.1* 28.0* 26.3* 23.4* 20.2* --  MCV 88.4 86.2 87.4 -- 87.1 92.5  PLT 160 139* 139* -- 133* 133*    Cardiac Enzymes: No results found for this basename: CKTOTAL:5,CKMB:5,CKMBINDEX:5,TROPONINI:5 in the last 168 hours  BNP: No components found with  this basename: POCBNP:5  CBG:  Lab 09/05/12 0752 09/04/12 2153 09/04/12 1615 09/04/12 1123 09/04/12 0731  GLUCAP 167* 202* 169* 228* 235*    Microbiology: Results for orders placed during the hospital encounter of 02/14/10  URINE CULTURE     Status: Normal   Collection Time   02/14/10  6:12 PM      Component Value Range Status Comment   Specimen Description URINE, CLEAN CATCH   Final    Special Requests NONE   Final    Colony Count 90,000 COLONIES/ML   Final    Culture     Final    Value: Multiple bacterial morphotypes present, none predominant. Suggest appropriate recollection if clinically indicated.   Report Status 02/16/2010 FINAL   Final     Coagulation Studies: No results found for this basename: LABPROT:5,INR:5 in the last 72 hours  Urinalysis: No results found for this basename: COLORURINE:2,APPERANCEUR:2,LABSPEC:2,PHURINE:2,GLUCOSEU:2,HGBUR:2,BILIRUBINUR:2,KETONESUR:2,PROTEINUR:2,UROBILINOGEN:2,NITRITE:2,LEUKOCYTESUR:2 in the last 72 hours    Imaging: No results found.   Medications:      . dialysis solution 1.5% low-MG/low-CA 5,000 mL (09/04/12 2309)  . dialysis solution 2.5% low-MG/low-CA 5,000 mL (09/04/12 2309)      . calcium acetate  2,668 mg  Oral TID WC  . carvedilol  3.125 mg Oral BID WC  . darbepoetin (ARANESP) injection - DIALYSIS  100 mcg Subcutaneous Q Wed-HD  . enoxaparin (LOVENOX) injection  30 mg Subcutaneous q1800  . famotidine  20 mg Oral Daily  . fosamprenavir  700 mg Oral BID WC  . furosemide  40 mg Oral Daily  . gabapentin  100 mg Oral Daily  . insulin aspart  0-15 Units Subcutaneous TID WC  . insulin aspart  0-5 Units Subcutaneous QHS  . insulin aspart  5 Units Subcutaneous TID WC  . insulin glargine  16 Units Subcutaneous BH-q7a  . latanoprost  1 drop Both Eyes QHS  . metoCLOPramide  5 mg Oral TID AC  . pravastatin  20 mg Oral q1800  . predniSONE  40 mg Oral QAC breakfast  . raltegravir  400 mg Oral BID  . ritonavir  100 mg Oral  BID WC  . sevelamer  800 mg Oral TID WC  . sodium chloride  3 mL Intravenous Q12H  . sodium chloride  3 mL Intravenous Q12H  . sulfamethoxazole-trimethoprim  1 tablet Oral Daily  . tenofovir  300 mg Oral Q7 days  . [DISCONTINUED] fosamprenavir  1,400 mg Oral BID WC  . [DISCONTINUED] fosamprenavir  1,400 mg Oral BID WC  . [DISCONTINUED] insulin glargine  14 Units Subcutaneous BH-q7a  . [DISCONTINUED] pantoprazole  40 mg Oral Daily  . 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

## 2012-09-05 NOTE — Progress Notes (Addendum)
Per pt he does not need 3:1 however does need rolling walker, this was ordered from [facility]  For delivery to pt hospital room. Will offer choice for [facility] agencies for HHPT. Johny Shock RN MPH Case manager  Patient selected [facility] for HHPT, MD please enter order, thanks. Johny Shock RN MPH [phone]

## 2012-09-05 NOTE — Progress Notes (Signed)
[facility] contacted to let them know that the patient has been d/c'ed and confirmed all hh orders written.

## 2012-09-07 ENCOUNTER — Encounter (HOSPITAL_COMMUNITY): Payer: Self-pay | Admitting: Internal Medicine

## 2013-01-31 ENCOUNTER — Inpatient Hospital Stay (HOSPITAL_COMMUNITY)
Admission: EM | Admit: 2013-01-31 | Discharge: 2013-02-02 | DRG: 977 | Disposition: A | Discharge status: Home / Self Care | Payer: Non-veteran care | Attending: Internal Medicine | Admitting: Internal Medicine

## 2013-01-31 ENCOUNTER — Encounter (HOSPITAL_COMMUNITY): Payer: Self-pay

## 2013-01-31 DIAGNOSIS — I10 Essential (primary) hypertension: Secondary | ICD-10-CM | POA: Diagnosis present

## 2013-01-31 DIAGNOSIS — Z8249 Family history of ischemic heart disease and other diseases of the circulatory system: Secondary | ICD-10-CM

## 2013-01-31 DIAGNOSIS — N186 End stage renal disease: Secondary | ICD-10-CM | POA: Diagnosis present

## 2013-01-31 DIAGNOSIS — Z992 Dependence on renal dialysis: Secondary | ICD-10-CM

## 2013-01-31 DIAGNOSIS — K922 Gastrointestinal hemorrhage, unspecified: Secondary | ICD-10-CM | POA: Diagnosis present

## 2013-01-31 DIAGNOSIS — B20 Human immunodeficiency virus [HIV] disease: Principal | ICD-10-CM | POA: Diagnosis present

## 2013-01-31 DIAGNOSIS — D509 Iron deficiency anemia, unspecified: Secondary | ICD-10-CM | POA: Diagnosis present

## 2013-01-31 DIAGNOSIS — E1129 Type 2 diabetes mellitus with other diabetic kidney complication: Secondary | ICD-10-CM | POA: Diagnosis present

## 2013-01-31 DIAGNOSIS — Z8619 Personal history of other infectious and parasitic diseases: Secondary | ICD-10-CM

## 2013-01-31 DIAGNOSIS — N058 Unspecified nephritic syndrome with other morphologic changes: Secondary | ICD-10-CM | POA: Diagnosis present

## 2013-01-31 DIAGNOSIS — Z794 Long term (current) use of insulin: Secondary | ICD-10-CM

## 2013-01-31 DIAGNOSIS — I120 Hypertensive chronic kidney disease with stage 5 chronic kidney disease or end stage renal disease: Secondary | ICD-10-CM | POA: Diagnosis present

## 2013-01-31 DIAGNOSIS — Z79899 Other long term (current) drug therapy: Secondary | ICD-10-CM

## 2013-01-31 DIAGNOSIS — N039 Chronic nephritic syndrome with unspecified morphologic changes: Secondary | ICD-10-CM | POA: Diagnosis present

## 2013-01-31 DIAGNOSIS — F17200 Nicotine dependence, unspecified, uncomplicated: Secondary | ICD-10-CM | POA: Diagnosis present

## 2013-01-31 DIAGNOSIS — D649 Anemia, unspecified: Secondary | ICD-10-CM

## 2013-01-31 DIAGNOSIS — G709 Myoneural disorder, unspecified: Secondary | ICD-10-CM | POA: Diagnosis present

## 2013-01-31 DIAGNOSIS — D631 Anemia in chronic kidney disease: Secondary | ICD-10-CM | POA: Diagnosis present

## 2013-01-31 LAB — CBC WITH DIFFERENTIAL/PLATELET
Basophils Absolute: 0 10*3/uL (ref 0.0–0.1)
Basophils Relative: 0 % (ref 0–1)
Eosinophils Absolute: 0.3 10*3/uL (ref 0.0–0.7)
Eosinophils Relative: 3 % (ref 0–5)
MCH: 31.6 pg (ref 26.0–34.0)
MCV: 93.7 fL (ref 78.0–100.0)
Monocytes Absolute: 0.8 10*3/uL (ref 0.1–1.0)
Neutrophils Relative %: 64 % (ref 43–77)
Platelets: 169 10*3/uL (ref 150–400)
RDW: 15.6 % — ABNORMAL HIGH (ref 11.5–15.5)

## 2013-01-31 LAB — COMPREHENSIVE METABOLIC PANEL
Alkaline Phosphatase: 111 U/L (ref 39–117)
BUN: 56 mg/dL — ABNORMAL HIGH (ref 6–23)
Calcium: 8.8 mg/dL (ref 8.4–10.5)
Creatinine, Ser: 15.71 mg/dL — ABNORMAL HIGH (ref 0.50–1.35)
GFR calc Af Amer: 3 mL/min — ABNORMAL LOW (ref >90)
Glucose, Bld: 110 mg/dL — ABNORMAL HIGH (ref 70–99)
Total Protein: 7.2 g/dL (ref 6.0–8.3)

## 2013-01-31 LAB — POCT I-STAT, CHEM 8
BUN: 55 mg/dL — ABNORMAL HIGH (ref 6–23)
Calcium, Ion: 1.03 mmol/L — ABNORMAL LOW (ref 1.13–1.30)
Chloride: 99 mEq/L (ref 96–112)
Creatinine, Ser: 15.7 mg/dL — ABNORMAL HIGH (ref 0.50–1.35)
Glucose, Bld: 111 mg/dL — ABNORMAL HIGH (ref 70–99)
HCT: 20 % — ABNORMAL LOW (ref 39.0–52.0)
Hemoglobin: 6.8 g/dL — CL (ref 13.0–17.0)
Potassium: 3.7 mEq/L (ref 3.5–5.1)
Sodium: 137 mEq/L (ref 135–145)
TCO2: 23 mmol/L (ref 0–100)

## 2013-01-31 LAB — IRON AND TIBC
Iron: 44 ug/dL (ref 42–135)
Saturation Ratios: 15 % — ABNORMAL LOW (ref 20–55)
TIBC: 287 ug/dL (ref 215–435)
UIBC: 243 ug/dL (ref 125–400)

## 2013-01-31 LAB — TROPONIN I: Troponin I: <0.3 ng/mL (ref <0.30)

## 2013-01-31 LAB — PREPARE RBC (CROSSMATCH)

## 2013-01-31 LAB — APTT: aPTT: 43 seconds — ABNORMAL HIGH (ref 24–37)

## 2013-01-31 LAB — RETICULOCYTES
RBC.: 1.9 MIL/uL — ABNORMAL LOW (ref 4.22–5.81)
RBC.: 1.83 MIL/uL — ABNORMAL LOW (ref 4.22–5.81)
Retic Count, Absolute: 76 10*3/uL (ref 19.0–186.0)
Retic Ct Pct: 4 % — ABNORMAL HIGH (ref 0.4–3.1)

## 2013-01-31 LAB — PROTIME-INR
INR: 1.17 (ref 0.00–1.49)
Prothrombin Time: 14.7 seconds (ref 11.6–15.2)

## 2013-01-31 LAB — VITAMIN B12: Vitamin B-12: 1783 pg/mL — ABNORMAL HIGH (ref 211–911)

## 2013-01-31 LAB — FOLATE: Folate: >20 ng/mL

## 2013-01-31 LAB — FERRITIN: Ferritin: 62 ng/mL (ref 22–322)

## 2013-01-31 LAB — PHOSPHORUS: Phosphorus: 5.6 mg/dL — ABNORMAL HIGH (ref 2.3–4.6)

## 2013-01-31 MED ORDER — FAMOTIDINE IN NACL 20-0.9 MG/50ML-% IV SOLN
20.0000 mg | INTRAVENOUS | Status: AC
  Administered 2013-01-31: 20 mg via INTRAVENOUS
  Filled 2013-01-31: qty 50

## 2013-01-31 MED ORDER — ACETAMINOPHEN 325 MG PO TABS: 650.0000 mg | ORAL_TABLET | Freq: Four times a day (QID) | ORAL | Status: DC | PRN

## 2013-01-31 MED ORDER — CALCIUM ACETATE 667 MG PO CAPS
2668.0000 mg | ORAL_CAPSULE | Freq: Three times a day (TID) | ORAL | Status: DC
  Administered 2013-02-01 – 2013-02-02 (×5): 2668 mg via ORAL
  Filled 2013-01-31 (×7): qty 4

## 2013-01-31 MED ORDER — PANTOPRAZOLE SODIUM 40 MG IV SOLR
40.0000 mg | INTRAVENOUS | Status: AC
  Administered 2013-01-31: 40 mg via INTRAVENOUS
  Filled 2013-01-31: qty 40

## 2013-01-31 MED ORDER — POLYVINYL ALCOHOL 1.4 % OP SOLN
1.0000 [drp] | Freq: Four times a day (QID) | OPHTHALMIC | Status: DC
  Administered 2013-02-01 – 2013-02-02 (×4): 1 [drp] via OPHTHALMIC
  Filled 2013-01-31 (×2): qty 15

## 2013-01-31 MED ORDER — FUROSEMIDE 80 MG PO TABS
80.0000 mg | ORAL_TABLET | Freq: Two times a day (BID) | ORAL | Status: DC
  Administered 2013-01-31 – 2013-02-02 (×4): 80 mg via ORAL
  Filled 2013-01-31 (×6): qty 1

## 2013-01-31 MED ORDER — SODIUM CHLORIDE 0.9 % IV SOLN: 250.0000 mL | INTRAVENOUS | Status: DC | PRN

## 2013-01-31 MED ORDER — ATORVASTATIN CALCIUM 10 MG PO TABS
5.0000 mg | ORAL_TABLET | Freq: Every day | ORAL | Status: DC
  Administered 2013-02-01: 5 mg via ORAL
  Filled 2013-01-31 (×2): qty 0.5

## 2013-01-31 MED ORDER — RITONAVIR 100 MG PO CAPS
100.0000 mg | ORAL_CAPSULE | Freq: Two times a day (BID) | ORAL | Status: DC
  Filled 2013-01-31 (×3): qty 1

## 2013-01-31 MED ORDER — OXYCODONE HCL 5 MG PO TABS: 5.0000 mg | ORAL_TABLET | Freq: Four times a day (QID) | ORAL | Status: DC | PRN

## 2013-01-31 MED ORDER — DELFLEX-LC/1.5% DEXTROSE 346 MOSM/L IP SOLN
INTRAPERITONEAL | Status: AC
  Administered 2013-02-01: 5000 mL via INTRAPERITONEAL

## 2013-01-31 MED ORDER — INSULIN ASPART 100 UNIT/ML ~~LOC~~ SOLN
0.0000 [IU] | Freq: Three times a day (TID) | SUBCUTANEOUS | Status: DC
  Administered 2013-02-02: 2 [IU] via SUBCUTANEOUS
  Administered 2013-02-02: 1 [IU] via SUBCUTANEOUS

## 2013-01-31 MED ORDER — RITONAVIR 100 MG PO TABS
100.0000 mg | ORAL_TABLET | Freq: Two times a day (BID) | ORAL | Status: DC
  Administered 2013-02-01 – 2013-02-02 (×3): 100 mg via ORAL
  Filled 2013-01-31 (×5): qty 1

## 2013-01-31 MED ORDER — SODIUM CHLORIDE 0.9 % IV SOLN
INTRAVENOUS | Status: AC
  Administered 2013-01-31: 125 mL/h via INTRAVENOUS

## 2013-01-31 MED ORDER — FERROUS SULFATE 325 (65 FE) MG PO TABS
325.0000 mg | ORAL_TABLET | Freq: Three times a day (TID) | ORAL | Status: DC
  Administered 2013-02-01 – 2013-02-02 (×5): 325 mg via ORAL
  Filled 2013-01-31 (×7): qty 1

## 2013-01-31 MED ORDER — SODIUM CHLORIDE 0.9 % IJ SOLN: 3.0000 mL | Freq: Two times a day (BID) | INTRAMUSCULAR | Status: DC

## 2013-01-31 MED ORDER — LORATADINE 10 MG PO TABS
10.0000 mg | ORAL_TABLET | Freq: Every day | ORAL | Status: DC
  Administered 2013-02-01 – 2013-02-02 (×2): 10 mg via ORAL
  Filled 2013-01-31 (×2): qty 1

## 2013-01-31 MED ORDER — ACETAMINOPHEN 650 MG RE SUPP: 650.0000 mg | Freq: Four times a day (QID) | RECTAL | Status: DC | PRN

## 2013-01-31 MED ORDER — TENOFOVIR DISOPROXIL FUMARATE 300 MG PO TABS: 300.0000 mg | ORAL_TABLET | ORAL | Status: DC

## 2013-01-31 MED ORDER — ONDANSETRON HCL 4 MG/2ML IJ SOLN: 4.0000 mg | Freq: Three times a day (TID) | INTRAMUSCULAR | Status: AC | PRN

## 2013-01-31 MED ORDER — SIMVASTATIN 5 MG PO TABS: 5.0000 mg | ORAL_TABLET | Freq: Every day | ORAL | Status: DC

## 2013-01-31 MED ORDER — RENA-VITE PO TABS
1.0000 | ORAL_TABLET | Freq: Every day | ORAL | Status: DC
  Administered 2013-02-01 – 2013-02-02 (×2): 1 via ORAL
  Filled 2013-01-31 (×2): qty 1

## 2013-01-31 MED ORDER — ONDANSETRON HCL 4 MG/2ML IJ SOLN: 4.0000 mg | Freq: Four times a day (QID) | INTRAMUSCULAR | Status: DC | PRN

## 2013-01-31 MED ORDER — LATANOPROST 0.005 % OP SOLN
1.0000 [drp] | Freq: Every day | OPHTHALMIC | Status: DC
  Filled 2013-01-31 (×2): qty 2.5

## 2013-01-31 MED ORDER — GABAPENTIN 100 MG PO CAPS
100.0000 mg | ORAL_CAPSULE | Freq: Every day | ORAL | Status: DC
  Administered 2013-02-01 – 2013-02-02 (×2): 100 mg via ORAL
  Filled 2013-01-31 (×2): qty 1

## 2013-01-31 MED ORDER — SODIUM CHLORIDE 0.9 % IJ SOLN
3.0000 mL | Freq: Two times a day (BID) | INTRAMUSCULAR | Status: DC
  Administered 2013-02-01 (×2): 3 mL via INTRAVENOUS

## 2013-01-31 MED ORDER — ONDANSETRON HCL 4 MG PO TABS: 4.0000 mg | ORAL_TABLET | Freq: Four times a day (QID) | ORAL | Status: DC | PRN

## 2013-01-31 MED ORDER — FOSAMPRENAVIR CALCIUM 700 MG PO TABS: 700.0000 mg | ORAL_TABLET | Freq: Two times a day (BID) | ORAL | Status: DC

## 2013-01-31 MED ORDER — SODIUM CHLORIDE 0.9 % IJ SOLN: 3.0000 mL | INTRAMUSCULAR | Status: DC | PRN

## 2013-01-31 MED ORDER — METOCLOPRAMIDE HCL 5 MG PO TABS
5.0000 mg | ORAL_TABLET | Freq: Three times a day (TID) | ORAL | Status: DC
  Administered 2013-01-31 – 2013-02-02 (×5): 5 mg via ORAL
  Filled 2013-01-31 (×7): qty 1

## 2013-01-31 MED ORDER — DOCUSATE SODIUM 100 MG PO CAPS
100.0000 mg | ORAL_CAPSULE | Freq: Three times a day (TID) | ORAL | Status: DC
  Administered 2013-01-31 – 2013-02-02 (×5): 100 mg via ORAL
  Filled 2013-01-31 (×6): qty 1

## 2013-01-31 MED ORDER — FOSAMPRENAVIR CALCIUM 700 MG PO TABS
700.0000 mg | ORAL_TABLET | Freq: Two times a day (BID) | ORAL | Status: DC
  Administered 2013-02-01 – 2013-02-02 (×3): 700 mg via ORAL
  Filled 2013-01-31 (×5): qty 1

## 2013-01-31 MED ORDER — INSULIN GLARGINE 100 UNIT/ML ~~LOC~~ SOLN
28.0000 [IU] | Freq: Every morning | SUBCUTANEOUS | Status: DC
  Administered 2013-02-01 – 2013-02-02 (×2): 28 [IU] via SUBCUTANEOUS
  Filled 2013-01-31 (×2): qty 0.28

## 2013-01-31 MED ORDER — RALTEGRAVIR POTASSIUM 400 MG PO TABS
400.0000 mg | ORAL_TABLET | Freq: Two times a day (BID) | ORAL | Status: DC
  Administered 2013-01-31 – 2013-02-02 (×4): 400 mg via ORAL
  Filled 2013-01-31 (×5): qty 1

## 2013-01-31 MED ORDER — LEVALBUTEROL HCL 0.63 MG/3ML IN NEBU
0.6300 mg | INHALATION_SOLUTION | Freq: Four times a day (QID) | RESPIRATORY_TRACT | Status: DC | PRN
  Filled 2013-01-31: qty 3

## 2013-01-31 NOTE — ED Notes (Signed)
Pt. Was called by his PCP, Dr. Loleta Chance to come to ED pt.'s hgb is low.  Pt. Is tired, dizzy and weak

## 2013-01-31 NOTE — Consult Note (Addendum)
[facility]        RENAL CONSULT   Reason for Consult: Peritoneal dialysis patient admitted with severe anemia and GI bleed Referring Physician: Dr. Richarda Overlie  HPI: Benjamin Long is a 67 y.o. black man with end-stage renal disease who is on home peritoneal dialysis. He is followed at the [facility]. He says that Benjamin Long is the person to contact at the [facility] for information about him. His hemoglobin was 6.0 and he is admitted for transfusion and evaluation. Prior evaluations have shown numerous GI AVMs.  He says his dialysis is CCPD, 5 overnight exchanges, no daytime dwell, 2.2 L per exchange, usually 1.5% and 2.5% dextrose .    Past Medical History  Diagnosis Date  . ESRD on peritoneal dialysis   . HIV disease   . Hypertension   . Shortness of breath   . DM type 2 causing renal disease   . Neuromuscular disorder     tremers  . Cancer     hx of rectal cancer  . Anemia 09/02/2012  . Hepatitis     hx of hepatitis B  . Iron (Fe) deficiency anemia 09/02/2012    Family history-- his father died age 70 of "heart attack." His mother died age 74 of gallbladder cancer." He had 4 brothers and 5 sisters. 2 sisters have died--one of alcoholism one of gallbladder cancer. 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 Codeine Nausea And Vomiting    Medications:  I have reviewed the patient's current medications. Scheduled: . sodium chloride   Intravenous STAT  . [START ON 02/01/2013] atorvastatin  5 mg Oral q1800  . [START ON 02/01/2013] calcium acetate  2,668 mg Oral TID WC  . docusate  sodium  100 mg Oral TID  . [START ON 02/01/2013] ferrous sulfate  325 mg Oral TID WC  . [START ON 02/01/2013] fosamprenavir  700 mg Oral BID WC  . furosemide  80 mg Oral BID  . [START ON 02/01/2013] gabapentin  100 mg Oral Daily  . [START ON 02/01/2013] insulin aspart  0-9 Units Subcutaneous TID WC  . [START ON 02/01/2013] insulin glargine  28 Units Subcutaneous q morning - 10a  . latanoprost  1 drop Both Eyes QHS  . [START ON 02/01/2013] loratadine  10 mg Oral Daily  . metoCLOPramide  5 mg Oral TID  . [START ON 02/01/2013] multivitamin  1 tablet Oral Daily  . polyvinyl alcohol  1 drop Both Eyes QID  . raltegravir  400 mg Oral BID  . [START ON 02/01/2013] ritonavir  100 mg Oral BID WC  . [START ON 02/01/2013] ritonavir  100 mg Oral BID WC  . sodium chloride  3 mL Intravenous Q12H  . sodium chloride  3 mL Intravenous Q12H  . [START ON 02/10/2013] tenofovir  300 mg Oral Q7 days   Continuous: . dialysis solution 1.5% low-MG/low-CA      ROS- no angina, no claudication, no melena, no hematochezia, no gross hematuria, no renal colic, no decreased force of urinary stream, no nocturia, no doe, no orthopnea, no purulent  sputum, no hemoptysis, no weight gain or weight loss, no cold or heat intolerance.  Physical Exam Blood pressure 128/85, pulse 85, temperature 98.9 F (37.2 C), temperature source Oral, resp. rate 22, SpO2 100.00%. General- eating supper, in no distress Chest-clear Heart-no rub Abd-nontender-PD catheter in right rectus Extr-no edema, AV fistula left Neuro- right-handed, strength equal  Lab- hemoglobin 6.0, WBC 9200, PLT 169K Sodium 136, potassium 3.7, chloride 94, CO2 23, BUN 56, creatinine 15.7, calcium 8.8, phosphorus pending, albumin 2.4   Assessment/Plan: 1. End-stage renal disease on peritoneal dialysis-continuous CCPD-5 exchanges per night no daytime dwell will use 1.5% dextrose tonight because of lower blood pressure 2. GI bleed-transfusions are planned. Protonix. Frequent  measurement of hemoglobin Will try to get records from [facility] regarding recent evaluation. Check iron studies 3. HIV-continue current meds 4. DM-2-check CBGs  Eldred Long F 01/31/2013, 7:57 PM

## 2013-01-31 NOTE — H&P (Signed)
[facility] History and Physical  Benjamin Long ZOX:[id number] DOB: [birth year] DOA: 01/31/2013  Referring physician: ER  PCP: Evlyn Courier, MD   Chief Complaint: Symptomatic anemia   HPI:  67 y/o male with hx of HIV, recurrent Fe def anemia requiring multiple admission and mutliple transfusions > 3 in the last 6 months. According to the MR he was admitted in 11/13 with low Hgb and had CD4 of 160 - was sent out on bactrim prophylaxis. He has since had colonscopy and endoscopy (both done at the [place] in ) as well as a cameral pill study which he reports showed esophageal oozing / small bowel oozing which was resulting in multiple locations of GI bleeding. colonscopy was reported as normal. He has been taking his meds other than bactrim which he doesn't take anymore, has taken GI antacid meds but cannot tell me the name of his meds and didn't bring them with him. He has a history of anal cancer status postchemotherapy and a half years ago. He is being followed by hematologist in the outpatient setting and was told that his anemia is likely secondary to end-stage renal disease.        Review of Systems: negative for the following  Constitutional: Denies fever, chills, diaphoresis, appetite change and fatigue.  HEENT: Denies photophobia, eye pain, redness, hearing loss, ear pain, congestion, sore throat, rhinorrhea, sneezing, mouth sores, trouble swallowing, neck pain, neck stiffness and tinnitus.  Respiratory: Denies SOB, DOE, cough, chest tightness, and wheezing.  Cardiovascular: Denies chest pain, palpitations and leg swelling.  Gastrointestinal: Denies nausea, vomiting, abdominal pain, diarrhea, constipation, blood in stool and abdominal distention.  Genitourinary: Denies dysuria, urgency, frequency, hematuria, flank pain and difficulty urinating.  Musculoskeletal: Denies myalgias, back pain, joint swelling, arthralgias and gait problem.  Skin: Denies pallor, rash and wound.   Neurological: Denies dizziness, seizures, syncope, weakness, light-headedness, numbness and headaches.  Hematological: Denies adenopathy. Easy bruising, personal or family bleeding history  Psychiatric/Behavioral: Denies suicidal ideation, mood changes, confusion, nervousness, sleep disturbance and agitation       Past Medical History  Diagnosis Date  . ESRD on peritoneal dialysis   . HIV disease   . Hypertension   . Shortness of breath   . DM type 2 causing renal disease   . Neuromuscular disorder     tremers  . Cancer     hx of rectal cancer  . Anemia 09/02/2012  . Hepatitis     hx of hepatitis B  . Iron (Fe) deficiency anemia 09/02/2012     Past Surgical History  Procedure Laterality Date  . Hemorroidectomy    . Insertion of dialysis catheter    . Hernia repair      TESTICLE      Social History:  reports that he has been smoking Cigarettes.  He has a 12.5 pack-year smoking history. He has never used smokeless tobacco. He reports that he does not drink alcohol or use illicit drugs.    Allergies  Allergen Reactions  . Codeine Nausea And Vomiting    No family history on file.   Prior to Admission medications   Medication Sig Start Date End Date Taking? Authorizing Provider  allopurinol (ZYLOPRIM) 100 MG tablet Take 100 mg by mouth daily.   Yes Historical Provider, MD  aspirin EC 81 MG tablet Take 81 mg by mouth daily.   Yes Historical Provider, MD  calcium acetate (PHOSLO) 667 MG capsule Take 2,668 mg by mouth 3 (three) times daily with meals. Takes  4 capsules   Yes Historical Provider, MD  docusate sodium (COLACE) 100 MG capsule Take 100 mg by mouth 3 (three) times daily.    Yes Historical Provider, MD  ferrous sulfate 325 (65 FE) MG tablet Take 325 mg by mouth 3 (three) times daily with meals.    Yes Historical Provider, MD  fosamprenavir (LEXIVA) 700 MG tablet Take 700 mg by mouth 2 (two) times daily with a meal. 09/05/12  Yes Rodolph Bong, MD   furosemide (LASIX) 80 MG tablet Take 80 mg by mouth 2 (two) times daily.   Yes Historical Provider, MD  gabapentin (NEURONTIN) 100 MG capsule Take 100 mg by mouth daily.   Yes Historical Provider, MD  insulin glargine (LANTUS) 100 UNIT/ML injection Inject 28 Units into the skin every morning. 09/05/12  Yes Rodolph Bong, MD  insulin regular (NOVOLIN R,HUMULIN R) 100 units/mL injection Inject 14-22 Units into the skin See admin instructions. Pt takes anywhere from 14-22 units 3 times daily as directed per sliding scale. Pt also takes 18 units at night prior to home dialysis.   Yes Historical Provider, MD  latanoprost (XALATAN) 0.005 % ophthalmic solution Place 1 drop into both eyes at bedtime.   Yes Historical Provider, MD  loratadine (CLARITIN) 10 MG tablet Take 10 mg by mouth daily.   Yes Historical Provider, MD  metoCLOPramide (REGLAN) 5 MG tablet Take 5 mg by mouth 3 (three) times daily.   Yes Historical Provider, MD  multivitamin (RENA-VIT) TABS tablet Take 1 tablet by mouth daily.   Yes Historical Provider, MD  polyvinyl alcohol (LIQUIFILM TEARS) 1.4 % ophthalmic solution Place 1 drop into both eyes 4 (four) times daily.   Yes Historical Provider, MD  pravastatin (PRAVACHOL) 20 MG tablet Take 1 tablet (20 mg total) by mouth daily at 6 PM. 09/05/12  Yes Rodolph Bong, MD  raltegravir (ISENTRESS) 400 MG tablet Take 400 mg by mouth 2 (two) times daily.   Yes Historical Provider, MD  ritonavir (NORVIR) 100 MG capsule Take 100 mg by mouth 2 (two) times daily.   Yes Historical Provider, MD  sildenafil (VIAGRA) 50 MG tablet Take 50 mg by mouth daily as needed. For erectile dysfunction   Yes Historical Provider, MD  tenofovir (VIREAD) 300 MG tablet Take 300 mg by mouth every 7 (seven) days. Take on Tuesdays after hemodialysis.   Yes Historical Provider, MD     Physical Exam: Filed Vitals:   01/31/13 1645 01/31/13 1700 01/31/13 1715 01/31/13 1730  BP: 115/54 124/53 116/47 132/90  Pulse: 84  84 80 83  Temp:      TempSrc:      Resp:      SpO2: 99% 100% 98% 100%     Constitutional: Vital signs reviewed. Patient is a well-developed and well-nourished in no acute distress and cooperative with exam. Alert and oriented x3.  Head: Normocephalic and atraumatic  Ear: TM normal bilaterally  Mouth: no erythema or exudates, MMM  Eyes: PERRL, EOMI, conjunctivae normal, No scleral icterus.  Neck: Supple, Trachea midline normal ROM, No JVD, mass, thyromegaly, or carotid bruit present.  Cardiovascular: RRR, S1 normal, S2 normal, no MRG, pulses symmetric and intact bilaterally  Pulmonary/Chest: CTAB, no wheezes, rales, or rhonchi  Abdominal: Soft. Non-tender, non-distended, bowel sounds are normal, no masses, organomegaly, or guarding present.  GU: no CVA tenderness Musculoskeletal: No joint deformities, erythema, or stiffness, ROM full and no nontender Ext: no edema and no cyanosis, pulses palpable bilaterally (DP and PT)  Hematology: no cervical, inginal, or axillary adenopathy.  Neurological: A&O x3, Strenght is normal and symmetric bilaterally, cranial nerve II-XII are grossly intact, no focal motor deficit, sensory intact to light touch bilaterally.  Skin: Warm, dry and intact. No rash, cyanosis, or clubbing.  Psychiatric: Normal mood and affect. speech and behavior is normal. Judgment and thought content normal. Cognition and memory are normal.       Labs on Admission:    Basic Metabolic Panel:  Recent Labs Lab 01/31/13 1600 01/31/13 1619  NA 136 137  K 3.7 3.7  CL 94* 99  CO2 23  --   GLUCOSE 110* 111*  BUN 56* 55*  CREATININE 15.71* 15.70*  CALCIUM 8.8  --    Liver Function Tests:  Recent Labs Lab 01/31/13 1600  AST 25  ALT 12  ALKPHOS 111  BILITOT 0.3  PROT 7.2  ALBUMIN 2.4*   No results found for this basename: LIPASE, AMYLASE,  in the last 168 hours No results found for this basename: AMMONIA,  in the last 168 hours CBC:  Recent Labs Lab  01/31/13 1600 01/31/13 1619  WBC 9.2  --   NEUTROABS 5.8  --   HGB 6.0* 6.8*  HCT 17.8* 20.0*  MCV 93.7  --   PLT 169  --    Cardiac Enzymes: No results found for this basename: CKTOTAL, CKMB, CKMBINDEX, TROPONINI,  in the last 168 hours  BNP (last 3 results) No results found for this basename: PROBNP,  in the last 8760 hours    CBG: No results found for this basename: GLUCAP,  in the last 168 hours  Radiological Exams on Admission: No results found.  EKG: Independently reviewed. *  Assessment/Plan Active Problems:   ESRD on peritoneal dialysis   HIV disease   Hypertension   DM type 2 causing renal disease   Iron (Fe) deficiency anemia   Anemia   ESRD (end stage renal disease)  Iron deficiency anemia Will transfuse 2 units of packed red blood cells, Continue Lasix Start the patient on a PPI drip Recent workup at the [facility] therefore not yet consultation will be obtained Patient also has HIV, will order a CD4 count, full anemia panel and parvovirus B19 No history of lymphoma Serial CBC every 12 hours  End-stage renal disease on peritoneal dialysis Will notify nephrology   HIV/AIDS No longer on PCP prophylaxis Repeat CD4 cell count Continue outpatient medications Followup with the [facility]     Code Status:   full Family Communication: bedside Disposition Plan: admit   Time spent: 70 mins   [facility] Pager [phone]  If 7PM-7AM, please contact night-coverage [email or web address] Password [facility] 01/31/2013, 6:21 PM

## 2013-01-31 NOTE — ED Notes (Signed)
Critical i-STAT CHEM8+ was shown to Dr. Miller. 

## 2013-01-31 NOTE — ED Provider Notes (Signed)
History     CSN: [id number]  Arrival date & time 01/31/13  1519   First MD Initiated Contact with Patient 01/31/13 1529      Chief Complaint  Patient presents with  . Labs Only    (Consider location/radiation/quality/duration/timing/severity/associated sxs/prior treatment) HPI Comments: Pt is a 67 y/o male with hx of HIV, recurrent Fe def anemia requiring multiple admission and mutliple transfusions > 3 in the last 6 months.  According to the MR he was admitted in 11/13 with low Hgb and had CD4 of 160 - was sent out on bactrim prophylaxis.  He has since had colonscopy and endoscopy (both done at the [place] in [place]) as well as a cameral pill study which he reports showed esophageal oozing / small bowel oozing which was resulting in multiple locations of GI bleeding.  colonscopy was reported as normal.  He has been taking his meds other than bactrim which he doesn't take anymore, has taken GI antacid meds but cannot tell me the name of his meds and didn't bring them with him.  According to last med list in EMR, he was not on any PPI or H2.    The cc is that he feels weak and fatigued - started 1 week ago, gradually worsening, persistent, associated with light headedness but no f/c/n/v/cough/sob or welling / rashes.  Notes occasional GI bleeding and had a BM with small amount of dark blood this week.   The history is provided by the patient, a friend and medical records.    Past Medical History  Diagnosis Date  . ESRD on peritoneal dialysis   . HIV disease   . Hypertension   . Shortness of breath   . DM type 2 causing renal disease   . Neuromuscular disorder     tremers  . Cancer     hx of rectal cancer  . Anemia 09/02/2012  . Hepatitis     hx of hepatitis B  . Iron (Fe) deficiency anemia 09/02/2012    Past Surgical History  Procedure Laterality Date  . Hemorroidectomy    . Insertion of dialysis catheter    . Hernia repair      TESTICLE    No family history on file.  History   Substance Use Topics  . Smoking status: Current Every Day Smoker -- 0.25 packs/day for 50 years    Types: Cigarettes  . Smokeless tobacco: Never Used  . Alcohol Use: No      Review of Systems  All other systems reviewed and are negative.    Allergies  Codeine  Home Medications   Current Outpatient Rx  Name  Route  Sig  Dispense  Refill  . allopurinol (ZYLOPRIM) 100 MG tablet   Oral   Take 100 mg by mouth daily.         Marland Kitchen aspirin EC 81 MG tablet   Oral   Take 81 mg by mouth daily.         . calcium acetate (PHOSLO) 667 MG capsule   Oral   Take 2,668 mg by mouth 3 (three) times daily with meals. Takes 4 capsules         . docusate sodium (COLACE) 100 MG capsule   Oral   Take 100 mg by mouth 3 (three) times daily.          . ferrous sulfate 325 (65 FE) MG tablet   Oral   Take 325 mg by mouth 3 (three) times daily with meals.          Marland Kitchen  fosamprenavir (LEXIVA) 700 MG tablet   Oral   Take 700 mg by mouth 2 (two) times daily with a meal.         . furosemide (LASIX) 80 MG tablet   Oral   Take 80 mg by mouth 2 (two) times daily.         Marland Kitchen gabapentin (NEURONTIN) 100 MG capsule   Oral   Take 100 mg by mouth daily.         . insulin glargine (LANTUS) 100 UNIT/ML injection   Subcutaneous   Inject 28 Units into the skin every morning.         . insulin regular (NOVOLIN R,HUMULIN R) 100 units/mL injection   Subcutaneous   Inject 14-22 Units into the skin See admin instructions. Pt takes anywhere from 14-22 units 3 times daily as directed per sliding scale. Pt also takes 18 units at night prior to home dialysis.         Marland Kitchen latanoprost (XALATAN) 0.005 % ophthalmic solution   Both Eyes   Place 1 drop into both eyes at bedtime.         Marland Kitchen loratadine (CLARITIN) 10 MG tablet   Oral   Take 10 mg by mouth daily.         . metoCLOPramide (REGLAN) 5 MG tablet   Oral   Take 5 mg by mouth 3 (three) times daily.         . multivitamin (RENA-VIT)  TABS tablet   Oral   Take 1 tablet by mouth daily.         . polyvinyl alcohol (LIQUIFILM TEARS) 1.4 % ophthalmic solution   Both Eyes   Place 1 drop into both eyes 4 (four) times daily.         . pravastatin (PRAVACHOL) 20 MG tablet   Oral   Take 1 tablet (20 mg total) by mouth daily at 6 PM.   30 tablet   0   . raltegravir (ISENTRESS) 400 MG tablet   Oral   Take 400 mg by mouth 2 (two) times daily.         . ritonavir (NORVIR) 100 MG capsule   Oral   Take 100 mg by mouth 2 (two) times daily.         . sildenafil (VIAGRA) 50 MG tablet   Oral   Take 50 mg by mouth daily as needed. For erectile dysfunction         . tenofovir (VIREAD) 300 MG tablet   Oral   Take 300 mg by mouth every 7 (seven) days. Take on Tuesdays after hemodialysis.           BP 115/54  Pulse 84  Temp(Src) 98.9 F (37.2 C) (Oral)  Resp 20  SpO2 99%  Physical Exam  Nursing note and vitals reviewed. Constitutional: He appears well-developed and well-nourished. No distress.  HENT:  Head: Normocephalic and atraumatic.  Mouth/Throat: Oropharynx is clear and moist. No oropharyngeal exudate.  Eyes: EOM are normal. Pupils are equal, round, and reactive to light. Right eye exhibits no discharge. Left eye exhibits no discharge. No scleral icterus.  Pale conjunctiva  Neck: Normal range of motion. Neck supple. No JVD present. No thyromegaly present.  Cardiovascular: Normal rate, regular rhythm, normal heart sounds and intact distal pulses.  Exam reveals no gallop and no friction rub.   No murmur heard. No audible heart murmurs  Pulmonary/Chest: Effort normal and breath sounds normal. No respiratory distress. He has no  wheezes. He has no rales.  Abdominal: Soft. Bowel sounds are normal. He exhibits no distension and no mass. There is no tenderness.  Peritoneal dialysis catheter in place, no tenderness, no discharge from dialysis catheter site  Musculoskeletal: Normal range of motion. He exhibits  no edema and no tenderness.  Lymphadenopathy:    He has no cervical adenopathy.  Neurological: He is alert. Coordination normal.  Skin: Skin is warm and dry. No rash noted. No erythema. There is pallor.  Psychiatric: He has a normal mood and affect. His behavior is normal.    ED Course  Procedures (including critical care time)  Labs Reviewed  CBC WITH DIFFERENTIAL - Abnormal; Notable for the following:    RBC 1.90 (*)    Hemoglobin 6.0 (*)    HCT 17.8 (*)    RDW 15.6 (*)    All other components within normal limits  COMPREHENSIVE METABOLIC PANEL - Abnormal; Notable for the following:    Chloride 94 (*)    Glucose, Bld 110 (*)    BUN 56 (*)    Creatinine, Ser 15.71 (*)    Albumin 2.4 (*)    GFR calc non Af Amer 3 (*)    GFR calc Af Amer 3 (*)    All other components within normal limits  APTT - Abnormal; Notable for the following:    aPTT 43 (*)    All other components within normal limits  RETICULOCYTES - Abnormal; Notable for the following:    Retic Ct Pct 4.0 (*)    RBC. 1.90 (*)    All other components within normal limits  POCT I-STAT, CHEM 8 - Abnormal; Notable for the following:    BUN 55 (*)    Creatinine, Ser 15.70 (*)    Glucose, Bld 111 (*)    Calcium, Ion 1.03 (*)    Hemoglobin 6.8 (*)    HCT 20.0 (*)    All other components within normal limits  PROTIME-INR  VITAMIN B12  FOLATE  IRON AND TIBC  FERRITIN  TYPE AND SCREEN  RED CROSS HLA ABC TYPING  PREPARE RBC (CROSSMATCH)   No results found.   1. Anemia   2. ESRD (end stage renal disease)       MDM  The patient does not appear toxic however he does have the symptoms and signs consistent with worsening anemia. He reports that his family Dr. told him that his hemoglobin was below 7 and that he likely need a transfusion secondary to his progressive fatigue and weakness symptoms. Will retry labs, vital signs confirmed that the patient is hemodynamically stable though borderline hypotensive. I  question the patient's compliance with his medications as he is unable to me the names of any of his medicines and has ongoing recurrent GI bleeding. Will initiate Pepcid, Protonix, gentle fluid hydration, type and screen, anticipate admission with transfusion if anemia confirmed   Pt's hemaglobin is confirmed at 6.0 on our labs, renal function is known to be poor - is baseline essentially, has no leukopenia and platelets are normal.  K is normal, VS remain stable, transfusion ordered, will consult with hospitalist.   D/w Dr. Susie Cassette - will admit - holding orders written.  Vida Roller, MD 01/31/13 [phone]

## 2013-01-31 NOTE — Progress Notes (Signed)
Patient arrived to room with complaints of only dizziness when standing.  Patient placed on a bed alarm.  Vital signs stable.  Patient is alert and oriented.  Telemetry applied and centralized tele called.  Patient made aware of his inpatient status and plan to give two units PRBC's.

## 2013-02-01 ENCOUNTER — Encounter (HOSPITAL_COMMUNITY): Payer: Self-pay

## 2013-02-01 LAB — GLUCOSE, CAPILLARY
Glucose-Capillary: 240 mg/dL — ABNORMAL HIGH (ref 70–99)
Glucose-Capillary: 156 mg/dL — ABNORMAL HIGH (ref 70–99)

## 2013-02-01 LAB — TSH: TSH: 4.004 u[IU]/mL (ref 0.350–4.500)

## 2013-02-01 LAB — CBC
HCT: 18.4 % — ABNORMAL LOW (ref 39.0–52.0)
Hemoglobin: 6.1 g/dL — CL (ref 13.0–17.0)
MCV: 90 fL (ref 78.0–100.0)
Platelets: 154 10*3/uL (ref 150–400)
RBC: 1.98 MIL/uL — ABNORMAL LOW (ref 4.22–5.81)
RBC: 2.5 MIL/uL — ABNORMAL LOW (ref 4.22–5.81)
RDW: 15.8 % — ABNORMAL HIGH (ref 11.5–15.5)
WBC: 6.5 10*3/uL (ref 4.0–10.5)

## 2013-02-01 LAB — IRON AND TIBC
Iron: 64 ug/dL (ref 42–135)
Saturation Ratios: 24 % (ref 20–55)
TIBC: 272 ug/dL (ref 215–435)
UIBC: 208 ug/dL (ref 125–400)

## 2013-02-01 LAB — COMPREHENSIVE METABOLIC PANEL
ALT: 12 U/L (ref 0–53)
Albumin: 2.2 g/dL — ABNORMAL LOW (ref 3.5–5.2)
Alkaline Phosphatase: 138 U/L — ABNORMAL HIGH (ref 39–117)
BUN: 59 mg/dL — ABNORMAL HIGH (ref 6–23)
Calcium: 8.2 mg/dL — ABNORMAL LOW (ref 8.4–10.5)
GFR calc Af Amer: 3 mL/min — ABNORMAL LOW (ref >90)
Glucose, Bld: 175 mg/dL — ABNORMAL HIGH (ref 70–99)
Potassium: 3.9 mEq/L (ref 3.5–5.1)
Sodium: 136 mEq/L (ref 135–145)
Total Protein: 6.8 g/dL (ref 6.0–8.3)

## 2013-02-01 LAB — TROPONIN I: Troponin I: <0.3 ng/mL (ref <0.30)

## 2013-02-01 LAB — VITAMIN B12: Vitamin B-12: 1669 pg/mL — ABNORMAL HIGH (ref 211–911)

## 2013-02-01 LAB — HEMOGLOBIN A1C
Hgb A1c MFr Bld: 6.6 % — ABNORMAL HIGH (ref <5.7)
Mean Plasma Glucose: 143 mg/dL — ABNORMAL HIGH (ref <117)

## 2013-02-01 LAB — FOLATE: Folate: >20 ng/mL

## 2013-02-01 MED ORDER — POLYETHYLENE GLYCOL 3350 17 G PO PACK
17.0000 g | PACK | Freq: Every day | ORAL | Status: DC
  Filled 2013-02-01 (×2): qty 1

## 2013-02-01 MED ORDER — DELFLEX-LC/1.5% DEXTROSE 346 MOSM/L IP SOLN: Freq: Once | INTRAPERITONEAL | Status: DC

## 2013-02-01 MED ORDER — SODIUM CHLORIDE 0.9 % IV SOLN
125.0000 mg | Freq: Once | INTRAVENOUS | Status: AC
  Administered 2013-02-01: 125 mg via INTRAVENOUS
  Filled 2013-02-01: qty 10

## 2013-02-01 MED ORDER — PANTOPRAZOLE SODIUM 40 MG IV SOLR
40.0000 mg | Freq: Two times a day (BID) | INTRAVENOUS | Status: DC
  Administered 2013-02-01 – 2013-02-02 (×3): 40 mg via INTRAVENOUS
  Filled 2013-02-01 (×4): qty 40

## 2013-02-01 NOTE — Progress Notes (Signed)
Pt pre-transfusion bp 123/56, 1st 15 min bp 121/58, 1st hr bp 94/59. Transfusion paused, MD aware. Pt is asymptomatic, sleeping in bed but easily roused. Per MD continue transfusion. Will continue to monitor.

## 2013-02-01 NOTE — Progress Notes (Addendum)
[facility]  Subjective:  Feels better. Son (from [place]) at bedside.  Got 2 U PRBC and hgb today up to 7.7 Dr. Susie Cassette plans another 1 U PRBC today. He says PD went ok last night once adapter ([employer]) could be found.   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 rate 18, height 5' 11.5" (1.816 m), weight 96.525 kg (212 lb 12.8 oz), SpO2 100.00%.    PHYSICAL EXAM General- lying in bed, in no distress  Chest-clear  Heart-no rub Abd-nontender-PD catheter in right rectus  Extr-no edema, AV fistula left forearm Neuro- right-handed, strength equal    Lab Results:   Recent Labs Lab 01/31/13 1600 01/31/13 1619 01/31/13 1837 02/01/13 0435  NA 136 137  --  136  K 3.7 3.7  --  3.9  CL 94* 99  --  95*  CO2 23  --   --  23  BUN 56* 55*  --  59*  CREATININE 15.71* 15.70*  --  14.73*  GLUCOSE 110* 111*  --  175*  CALCIUM 8.8  --   --  8.2*  PHOS  --   --  5.6*  --      Recent Labs  02/01/13 0030 02/01/13 0435  WBC 6.5 6.5  HGB 6.1* 7.7*  HCT 18.4* 22.5*  PLT 133* 154     I have reviewed the patient's current medications. Scheduled: . atorvastatin  5 mg Oral q1800  . calcium acetate  2,668 mg Oral TID WC  . docusate sodium  100 mg Oral TID  . ferrous sulfate  325 mg Oral TID WC  . fosamprenavir  700 mg Oral BID WC  . furosemide  80 mg Oral BID  . gabapentin  100 mg Oral Daily  . insulin aspart  0-9 Units Subcutaneous TID WC  . insulin glargine  28 Units Subcutaneous q morning - 10a  . latanoprost  1 drop Both Eyes QHS  . loratadine  10 mg Oral Daily  . metoCLOPramide  5 mg Oral TID  . multivitamin  1 tablet Oral Daily  . pantoprazole (PROTONIX) IV  40 mg Intravenous Q12H  . polyethylene glycol  17 g Oral Daily  . polyvinyl alcohol  1 drop Both Eyes  QID  . raltegravir  400 mg Oral BID  . ritonavir  100 mg Oral BID WC  . sodium chloride  3 mL Intravenous Q12H  . sodium chloride  3 mL Intravenous Q12H  . [START ON 02/03/2013] tenofovir  300 mg Oral Q7 days   Continuous: . dialysis solution 1.5% low-MG/low-CA 5,000 mL (02/01/13 0000)    Assessment/Plan:  1. End-stage renal disease on peritoneal dialysis-continuous CCPD-5 exchanges per night no daytime dwell will use 1.5% dextrose again tonight because of lower blood pressure 2. GI bleed-- another transfusion is planned. Protonix. Frequent measurement of hemoglobin Will try to get records from [facility] regarding recent evaluation. Fe/TIBC 15 % sat, ferritin 62 yesterday--will Rx IV Fe--Nulecit 125 mg IV today and then QOD X 8 doses.  May need small bowel enteroscopy like he had last year 3. HIV-continue current meds 4. DM-2-check CBGs      LOS: 1 day   Shanele Nissan F 02/01/2013,12:36 PM   .labalb

## 2013-02-01 NOTE — Progress Notes (Signed)
[facility] PROGRESS NOTE  Benjamin Long ZOX:[id number] DOB: 19-Nov-1945 DOA: 01/31/2013 PCP: Evlyn Courier, MD  Assessment/Plan: Active Problems:   ESRD on peritoneal dialysis   HIV disease   Hypertension   DM type 2 causing renal disease   Iron (Fe) deficiency anemia   Anemia   ESRD (end stage renal disease)    Iron deficiency anemia  Hemoglobin 7.7 after 2 units of packed red blood cells,  Continue Lasix  Started on Protonix IV Recent workup at the [facility] therefore no indication for stat GI workup  Patient also has HIV, will order a CD4 count, full anemia panel and parvovirus B19  No history of lymphoma  Serial CBC every 12 hours   End-stage renal disease on peritoneal dialysis  Will notify nephrology  CCPD, 5 overnight exchanges, no daytime dwell, 2.2 L per exchange, usually 1.5% and 2.5% dextrose Appreciate nephrology input and  HIV/AIDS  No longer on PCP prophylaxis  CD4 cell count pending Continue outpatient medications  Followup with the [facility]   Code Status: full Family Communication: family updated about patient's clinical progress Disposition Plan:  Anticipate discharge tomorrow  Brief narrative: 67 y/o male with hx of HIV, recurrent Fe def anemia requiring multiple admission and mutliple transfusions > 3 in the last 6 months. According to the MR he was admitted in 11/13 with low Hgb and had CD4 of 160 - was sent out on bactrim prophylaxis. He has since had colonscopy and endoscopy (both done at the [place] in [place]) as well as a cameral pill study which he reports showed esophageal oozing / small bowel oozing which was resulting in multiple locations of GI bleeding. colonscopy was reported as normal. He has been taking his meds other than bactrim which he doesn't take anymore, has taken GI antacid meds but cannot tell me the name of his meds and didn't bring them with him. He has a history of anal cancer status postchemotherapy and a half years ago.  He is being followed by hematologist in the outpatient setting and was told that his anemia is likely secondary to end-stage renal disease   Consultants:    Procedures:  None   Antibiotics:  HPI/Subjective: Not as nauseous   Objective: Filed Vitals:   02/01/13 0339 02/01/13 0410 02/01/13 0537 02/01/13 0853  BP: 118/69 106/70 136/64 123/56  Pulse: 73  78 71  Temp: 98 F (36.7 C) 98.2 F (36.8 C) 98 F (36.7 C) 97.5 F (36.4 C)  TempSrc: Axillary Axillary Oral Oral  Resp: 19 18 18 18   Height:      Weight:      SpO2:  100% 100% 100%    Intake/Output Summary (Last 24 hours) at 02/01/13 0926 Last data filed at 02/01/13 0139  Gross per 24 hour  Intake   1000 ml  Output      0 ml  Net   1000 ml    Exam:  HENT:  Head: Atraumatic.  Nose: Nose normal.  Mouth/Throat: Oropharynx is clear and moist.  Eyes: Conjunctivae are normal. Pupils are equal, round, and reactive to light. No scleral icterus.  Neck: Neck supple. No tracheal deviation present.  Cardiovascular: Normal rate, regular rhythm, normal heart sounds and intact distal pulses.  Pulmonary/Chest: Effort normal and breath sounds normal. No respiratory distress.  Abdominal: Soft. Normal appearance and bowel sounds are normal. She exhibits no distension. There is no tenderness.  Musculoskeletal: She exhibits no edema and no tenderness.  Neurological: She is alert. No  cranial nerve deficit.    Data Reviewed: Basic Metabolic Panel:  Recent Labs Lab 01/31/13 1600 01/31/13 1619 01/31/13 1837 02/01/13 0435  NA 136 137  --  136  K 3.7 3.7  --  3.9  CL 94* 99  --  95*  CO2 23  --   --  23  GLUCOSE 110* 111*  --  175*  BUN 56* 55*  --  59*  CREATININE 15.71* 15.70*  --  14.73*  CALCIUM 8.8  --   --  8.2*  PHOS  --   --  5.6*  --     Liver Function Tests:  Recent Labs Lab 01/31/13 1600 02/01/13 0435  AST 25 24  ALT 12 12  ALKPHOS 111 138*  BILITOT 0.3 0.6  PROT 7.2 6.8  ALBUMIN 2.4* 2.2*   No  results found for this basename: LIPASE, AMYLASE,  in the last 168 hours No results found for this basename: AMMONIA,  in the last 168 hours  CBC:  Recent Labs Lab 01/31/13 1600 01/31/13 1619 02/01/13 0030 02/01/13 0435  WBC 9.2  --  6.5 6.5  NEUTROABS 5.8  --   --   --   HGB 6.0* 6.8* 6.1* 7.7*  HCT 17.8* 20.0* 18.4* 22.5*  MCV 93.7  --  92.9 90.0  PLT 169  --  133* 154    Cardiac Enzymes:  Recent Labs Lab 01/31/13 1837 02/01/13 0031 02/01/13 0435  TROPONINI <0.30 <0.30 <0.30   BNP (last 3 results) No results found for this basename: PROBNP,  in the last 8760 hours   CBG: No results found for this basename: GLUCAP,  in the last 168 hours  No results found for this or any previous visit (from the past 240 hour(s)).   Studies: No results found.  Scheduled Meds: . sodium chloride   Intravenous STAT  . atorvastatin  5 mg Oral q1800  . calcium acetate  2,668 mg Oral TID WC  . docusate sodium  100 mg Oral TID  . ferrous sulfate  325 mg Oral TID WC  . fosamprenavir  700 mg Oral BID WC  . furosemide  80 mg Oral BID  . gabapentin  100 mg Oral Daily  . insulin aspart  0-9 Units Subcutaneous TID WC  . insulin glargine  28 Units Subcutaneous q morning - 10a  . latanoprost  1 drop Both Eyes QHS  . loratadine  10 mg Oral Daily  . metoCLOPramide  5 mg Oral TID  . multivitamin  1 tablet Oral Daily  . polyvinyl alcohol  1 drop Both Eyes QID  . raltegravir  400 mg Oral BID  . ritonavir  100 mg Oral BID WC  . sodium chloride  3 mL Intravenous Q12H  . sodium chloride  3 mL Intravenous Q12H  . [START ON 02/03/2013] tenofovir  300 mg Oral Q7 days   Continuous Infusions: . dialysis solution 1.5% low-MG/low-CA 5,000 mL (02/01/13 0000)    Active Problems:   ESRD on peritoneal dialysis   HIV disease   Hypertension   DM type 2 causing renal disease   Iron (Fe) deficiency anemia   Anemia   ESRD (end stage renal disease)    Time spent: 40  minutes   [facility]  [facility] Pager [phone]. If 8PM-8AM, please contact night-coverage at [email or web address], password [facility] 02/01/2013, 9:26 AM  LOS: 1 day

## 2013-02-02 LAB — TYPE AND SCREEN
ABO/RH(D): O POS
Unit division: 0

## 2013-02-02 LAB — CBC
MCV: 88.8 fL (ref 78.0–100.0)
Platelets: 141 10*3/uL — ABNORMAL LOW (ref 150–400)
RBC: 2.77 MIL/uL — ABNORMAL LOW (ref 4.22–5.81)
RDW: 16.3 % — ABNORMAL HIGH (ref 11.5–15.5)
WBC: 6 10*3/uL (ref 4.0–10.5)

## 2013-02-02 LAB — RENAL FUNCTION PANEL
GFR calc Af Amer: 4 mL/min — ABNORMAL LOW (ref >90)
Glucose, Bld: 149 mg/dL — ABNORMAL HIGH (ref 70–99)
Phosphorus: 4.8 mg/dL — ABNORMAL HIGH (ref 2.3–4.6)
Potassium: 3.7 mEq/L (ref 3.5–5.1)
Sodium: 135 mEq/L (ref 135–145)

## 2013-02-02 LAB — GLUCOSE, CAPILLARY
Glucose-Capillary: 148 mg/dL — ABNORMAL HIGH (ref 70–99)
Glucose-Capillary: 179 mg/dL — ABNORMAL HIGH (ref 70–99)

## 2013-02-02 LAB — T-HELPER CELLS (CD4) COUNT (NOT AT ARMC): CD4 % Helper T Cell: 19 % — ABNORMAL LOW (ref 33–55)

## 2013-02-02 MED ORDER — PANTOPRAZOLE SODIUM 40 MG PO TBEC: 40.0000 mg | DELAYED_RELEASE_TABLET | Freq: Every day | ORAL | Status: DC

## 2013-02-02 NOTE — Progress Notes (Signed)
Utilization review completed.  

## 2013-02-02 NOTE — Progress Notes (Signed)
Physician Discharge Summary  Blanton Kardell MRN: [id number] DOB/AGE: 30-Sep-1946 67 y.o.  PCP: Evlyn Courier, MD   Admit date: 01/31/2013 Discharge date: 02/02/2013  Discharge Diagnoses:  Anemia of chronic disease s/p 3 units    ESRD on peritoneal dialysis   HIV disease   Hypertension   DM type 2 causing renal disease   Iron (Fe) deficiency anemia   Anemia   ESRD (end stage renal disease)     Medication List    STOP taking these medications       aspirin EC 81 MG tablet      TAKE these medications       allopurinol 100 MG tablet  Commonly known as:  ZYLOPRIM  Take 100 mg by mouth daily.     calcium acetate 667 MG capsule  Commonly known as:  PHOSLO  Take 2,668 mg by mouth 3 (three) times daily with meals. Takes 4 capsules     docusate sodium 100 MG capsule  Commonly known as:  COLACE  Take 100 mg by mouth 3 (three) times daily.     ferrous sulfate 325 (65 FE) MG tablet  Take 325 mg by mouth 3 (three) times daily with meals.     fosamprenavir 700 MG tablet  Commonly known as:  LEXIVA  Take 700 mg by mouth 2 (two) times daily with a meal.     furosemide 80 MG tablet  Commonly known as:  LASIX  Take 80 mg by mouth 2 (two) times daily.     gabapentin 100 MG capsule  Commonly known as:  NEURONTIN  Take 100 mg by mouth daily.     insulin glargine 100 UNIT/ML injection  Commonly known as:  LANTUS  Inject 28 Units into the skin every morning.     insulin regular 100 units/mL injection  Commonly known as:  NOVOLIN R,HUMULIN R  Inject 14-22 Units into the skin See admin instructions. Pt takes anywhere from 14-22 units 3 times daily as directed per sliding scale. Pt also takes 18 units at night prior to home dialysis.     latanoprost 0.005 % ophthalmic solution  Commonly known as:  XALATAN  Place 1 drop into both eyes at bedtime.     loratadine 10 MG tablet  Commonly known as:  CLARITIN  Take 10 mg by mouth daily.     metoCLOPramide 5 MG tablet   Commonly known as:  REGLAN  Take 5 mg by mouth 3 (three) times daily.     multivitamin Tabs tablet  Take 1 tablet by mouth daily.     pantoprazole 40 MG tablet  Commonly known as:  PROTONIX  Take 1 tablet (40 mg total) by mouth daily.     polyvinyl alcohol 1.4 % ophthalmic solution  Commonly known as:  LIQUIFILM TEARS  Place 1 drop into both eyes 4 (four) times daily.     pravastatin 20 MG tablet  Commonly known as:  PRAVACHOL  Take 1 tablet (20 mg total) by mouth daily at 6 PM.     raltegravir 400 MG tablet  Commonly known as:  ISENTRESS  Take 400 mg by mouth 2 (two) times daily.     ritonavir 100 MG capsule  Commonly known as:  NORVIR  Take 100 mg by mouth 2 (two) times daily.     sildenafil 50 MG tablet  Commonly known as:  VIAGRA  Take 50 mg by mouth daily as needed. For erectile dysfunction     tenofovir 300  MG tablet  Commonly known as:  VIREAD  Take 300 mg by mouth every 7 (seven) days. Take on Tuesdays after hemodialysis.        Discharge Condition: stable  Disposition: 06-Home-Health Care Svc   Consults: nephrology   Significant Diagnostic Studies:    Microbiology: No results found for this or any previous visit (from the past 240 hour(s)).   Labs: Results for orders placed during the hospital encounter of 01/31/13 (from the past 48 hour(s))  CBC WITH DIFFERENTIAL     Status: Abnormal   Collection Time    01/31/13  4:00 PM      Result Value Range   WBC 9.2  4.0 - 10.5 K/uL   RBC 1.90 (*) 4.22 - 5.81 MIL/uL   Hemoglobin 6.0 (*) 13.0 - 17.0 g/dL   Comment: REPEATED TO VERIFY     CRITICAL RESULT CALLED TO, READ BACK BY AND VERIFIED WITH:     CRYSTAL ADKINS RN AT 1705 01/31/13 BY WOOLLENK   HCT 17.8 (*) 39.0 - 52.0 %   MCV 93.7  78.0 - 100.0 fL   MCH 31.6  26.0 - 34.0 pg   MCHC 33.7  30.0 - 36.0 g/dL   RDW 09.8 (*) 11.9 - 14.7 %   Platelets 169  150 - 400 K/uL   Neutrophils Relative 64  43 - 77 %   Neutro Abs 5.8  1.7 - 7.7 K/uL   Band  Neutrophils 0  0 - 10 %   Lymphocytes Relative 24  12 - 46 %   Lymphs Abs 2.2  0.7 - 4.0 K/uL   Monocytes Relative 9  3 - 12 %   Monocytes Absolute 0.8  0.1 - 1.0 K/uL   Eosinophils Relative 3  0 - 5 %   Eosinophils Absolute 0.3  0.0 - 0.7 K/uL   Basophils Relative 0  0 - 1 %   Basophils Absolute 0  0.0 - 0.1 K/uL   LUCs, % 0  0 - 4 %   LUC, Absolute 0  0.0 - 0.5 K/uL  COMPREHENSIVE METABOLIC PANEL     Status: Abnormal   Collection Time    01/31/13  4:00 PM      Result Value Range   Sodium 136  135 - 145 mEq/L   Potassium 3.7  3.5 - 5.1 mEq/L   Chloride 94 (*) 96 - 112 mEq/L   CO2 23  19 - 32 mEq/L   Glucose, Bld 110 (*) 70 - 99 mg/dL   BUN 56 (*) 6 - 23 mg/dL   Creatinine, Ser 82.95 (*) 0.50 - 1.35 mg/dL   Calcium 8.8  8.4 - 62.1 mg/dL   Total Protein 7.2  6.0 - 8.3 g/dL   Albumin 2.4 (*) 3.5 - 5.2 g/dL   AST 25  0 - 37 U/L   ALT 12  0 - 53 U/L   Alkaline Phosphatase 111  39 - 117 U/L   Total Bilirubin 0.3  0.3 - 1.2 mg/dL   GFR calc non Af Amer 3 (*) >90 mL/min   GFR calc Af Amer 3 (*) >90 mL/min   Comment:            The eGFR has been calculated     using the CKD EPI equation.     This calculation has not been     validated in all clinical     situations.     eGFR's persistently     <90 mL/min signify  possible Chronic Kidney Disease.  APTT     Status: Abnormal   Collection Time    01/31/13  4:00 PM      Result Value Range   aPTT 43 (*) 24 - 37 seconds   Comment:            IF BASELINE aPTT IS ELEVATED,     SUGGEST PATIENT RISK ASSESSMENT     BE USED TO DETERMINE APPROPRIATE     ANTICOAGULANT THERAPY.  PROTIME-INR     Status: None   Collection Time    01/31/13  4:00 PM      Result Value Range   Prothrombin Time 14.7  11.6 - 15.2 seconds   INR 1.17  0.00 - 1.49  TYPE AND SCREEN     Status: None   Collection Time    01/31/13  4:00 PM      Result Value Range   ABO/RH(D) O POS     Antibody Screen NEG     Sample Expiration 02/03/2013     Unit Number  [id number]     Blood Component Type RBC CPDA1, LR     Unit division 00     Status of Unit ISSUED,FINAL     Transfusion Status OK TO TRANSFUSE     Crossmatch Result Compatible     Unit Number [id number]     Blood Component Type RBC CPDA1, LR     Unit division 00     Status of Unit ISSUED,FINAL     Transfusion Status OK TO TRANSFUSE     Crossmatch Result Compatible     Unit Number [id number]     Blood Component Type RED CELLS,LR     Unit division 00     Status of Unit ISSUED,FINAL     Transfusion Status OK TO TRANSFUSE     Crossmatch Result Compatible    VITAMIN B12     Status: Abnormal   Collection Time    01/31/13  4:00 PM      Result Value Range   Vitamin B-12 1783 (*) 211 - 911 pg/mL  FOLATE     Status: None   Collection Time    01/31/13  4:00 PM      Result Value Range   Folate >20.0     Comment: (NOTE)     Reference Ranges            Deficient:       0.4 - 3.3 ng/mL            Indeterminate:   3.4 - 5.4 ng/mL            Normal:              > 5.4 ng/mL  IRON AND TIBC     Status: Abnormal   Collection Time    01/31/13  4:00 PM      Result Value Range   Iron 44  42 - 135 ug/dL   TIBC 595  638 - 756 ug/dL   Saturation Ratios 15 (*) 20 - 55 %   UIBC 243  125 - 400 ug/dL  FERRITIN     Status: None   Collection Time    01/31/13  4:00 PM      Result Value Range   Ferritin 62  22 - 322 ng/mL  RETICULOCYTES     Status: Abnormal   Collection Time    01/31/13  4:00 PM      Result Value Range  Retic Ct Pct 4.0 (*) 0.4 - 3.1 %   RBC. 1.90 (*) 4.22 - 5.81 MIL/uL   Retic Count, Manual 76.0  19.0 - 186.0 K/uL  POCT I-STAT, CHEM 8     Status: Abnormal   Collection Time    01/31/13  4:19 PM      Result Value Range   Sodium 137  135 - 145 mEq/L   Potassium 3.7  3.5 - 5.1 mEq/L   Chloride 99  96 - 112 mEq/L   BUN 55 (*) 6 - 23 mg/dL   Creatinine, Ser 16.10 (*) 0.50 - 1.35 mg/dL   Glucose, Bld 960 (*) 70 - 99 mg/dL   Calcium, Ion 4.54 (*) 1.13 - 1.30 mmol/L    TCO2 23  0 - 100 mmol/L   Hemoglobin 6.8 (*) 13.0 - 17.0 g/dL   HCT 09.8 (*) 11.9 - 14.7 %   Comment NOTIFIED PHYSICIAN    PREPARE RBC (CROSSMATCH)     Status: None   Collection Time    01/31/13  5:15 PM      Result Value Range   Order Confirmation ORDER PROCESSED BY BLOOD BANK    TROPONIN I     Status: None   Collection Time    01/31/13  6:37 PM      Result Value Range   Troponin I <0.30  <0.30 ng/mL   Comment:            Due to the release kinetics of cTnI,     a negative result within the first hours     of the onset of symptoms does not rule out     myocardial infarction with certainty.     If myocardial infarction is still suspected,     repeat the test at appropriate intervals.  VITAMIN B12     Status: Abnormal   Collection Time    01/31/13  6:37 PM      Result Value Range   Vitamin B-12 1669 (*) 211 - 911 pg/mL  FOLATE     Status: None   Collection Time    01/31/13  6:37 PM      Result Value Range   Folate >20.0     Comment: (NOTE)     Reference Ranges            Deficient:       0.4 - 3.3 ng/mL            Indeterminate:   3.4 - 5.4 ng/mL            Normal:              > 5.4 ng/mL  IRON AND TIBC     Status: None   Collection Time    01/31/13  6:37 PM      Result Value Range   Iron 64  42 - 135 ug/dL   TIBC 829  562 - 130 ug/dL   Saturation Ratios 24  20 - 55 %   UIBC 208  125 - 400 ug/dL  FERRITIN     Status: None   Collection Time    01/31/13  6:37 PM      Result Value Range   Ferritin 57  22 - 322 ng/mL  RETICULOCYTES     Status: Abnormal   Collection Time    01/31/13  6:37 PM      Result Value Range   Retic Ct Pct 3.5 (*) 0.4 - 3.1 %  RBC. 1.83 (*) 4.22 - 5.81 MIL/uL   Retic Count, Manual 64.1  19.0 - 186.0 K/uL  TSH     Status: None   Collection Time    01/31/13  6:37 PM      Result Value Range   TSH 4.004  0.350 - 4.500 uIU/mL  HEMOGLOBIN A1C     Status: Abnormal   Collection Time    01/31/13  6:37 PM      Result Value Range   Hemoglobin  A1C 6.6 (*) <5.7 %   Comment: (NOTE)                                                                               According to the ADA Clinical Practice Recommendations for 2011, when     HbA1c is used as a screening test:      >=6.5%   Diagnostic of Diabetes Mellitus               (if abnormal result is confirmed)     5.7-6.4%   Increased risk of developing Diabetes Mellitus     References:Diagnosis and Classification of Diabetes Mellitus,Diabetes     Care,2011,34(Suppl 1):S62-S69 and Standards of Medical Care in             Diabetes - 2011,Diabetes Care,2011,34 (Suppl 1):S11-S61.   Mean Plasma Glucose 143 (*) <117 mg/dL  PHOSPHORUS     Status: Abnormal   Collection Time    01/31/13  6:37 PM      Result Value Range   Phosphorus 5.6 (*) 2.3 - 4.6 mg/dL  GLUCOSE, CAPILLARY     Status: None   Collection Time    01/31/13  8:12 PM      Result Value Range   Glucose-Capillary 92  70 - 99 mg/dL  CBC     Status: Abnormal   Collection Time    02/01/13 12:30 AM      Result Value Range   WBC 6.5  4.0 - 10.5 K/uL   RBC 1.98 (*) 4.22 - 5.81 MIL/uL   Hemoglobin 6.1 (*) 13.0 - 17.0 g/dL   Comment: CRITICAL VALUE NOTED.  VALUE IS CONSISTENT WITH PREVIOUSLY REPORTED AND CALLED VALUE.   HCT 18.4 (*) 39.0 - 52.0 %   MCV 92.9  78.0 - 100.0 fL   MCH 30.8  26.0 - 34.0 pg   MCHC 33.2  30.0 - 36.0 g/dL   RDW 19.1  47.8 - 29.5 %   Platelets 133 (*) 150 - 400 K/uL  TROPONIN I     Status: None   Collection Time    02/01/13 12:31 AM      Result Value Range   Troponin I <0.30  <0.30 ng/mL   Comment:            Due to the release kinetics of cTnI,     a negative result within the first hours     of the onset of symptoms does not rule out     myocardial infarction with certainty.     If myocardial infarction is still suspected,     repeat the test at appropriate intervals.  COMPREHENSIVE METABOLIC PANEL     Status:  Abnormal   Collection Time    02/01/13  4:35 AM      Result Value Range   Sodium  136  135 - 145 mEq/L   Potassium 3.9  3.5 - 5.1 mEq/L   Chloride 95 (*) 96 - 112 mEq/L   CO2 23  19 - 32 mEq/L   Glucose, Bld 175 (*) 70 - 99 mg/dL   BUN 59 (*) 6 - 23 mg/dL   Creatinine, Ser 16.10 (*) 0.50 - 1.35 mg/dL   Calcium 8.2 (*) 8.4 - 10.5 mg/dL   Total Protein 6.8  6.0 - 8.3 g/dL   Albumin 2.2 (*) 3.5 - 5.2 g/dL   AST 24  0 - 37 U/L   ALT 12  0 - 53 U/L   Alkaline Phosphatase 138 (*) 39 - 117 U/L   Total Bilirubin 0.6  0.3 - 1.2 mg/dL   GFR calc non Af Amer 3 (*) >90 mL/min   GFR calc Af Amer 3 (*) >90 mL/min   Comment:            The eGFR has been calculated     using the CKD EPI equation.     This calculation has not been     validated in all clinical     situations.     eGFR's persistently     <90 mL/min signify     possible Chronic Kidney Disease.  TROPONIN I     Status: None   Collection Time    02/01/13  4:35 AM      Result Value Range   Troponin I <0.30  <0.30 ng/mL   Comment:            Due to the release kinetics of cTnI,     a negative result within the first hours     of the onset of symptoms does not rule out     myocardial infarction with certainty.     If myocardial infarction is still suspected,     repeat the test at appropriate intervals.  CBC     Status: Abnormal   Collection Time    02/01/13  4:35 AM      Result Value Range   WBC 6.5  4.0 - 10.5 K/uL   RBC 2.50 (*) 4.22 - 5.81 MIL/uL   Hemoglobin 7.7 (*) 13.0 - 17.0 g/dL   Comment: POST TRANSFUSION SPECIMEN   HCT 22.5 (*) 39.0 - 52.0 %   MCV 90.0  78.0 - 100.0 fL   MCH 30.8  26.0 - 34.0 pg   MCHC 34.2  30.0 - 36.0 g/dL   RDW 96.0 (*) 45.4 - 09.8 %   Platelets 154  150 - 400 K/uL  GLUCOSE, CAPILLARY     Status: Abnormal   Collection Time    02/01/13  8:16 AM      Result Value Range   Glucose-Capillary 143 (*) 70 - 99 mg/dL  PREPARE RBC (CROSSMATCH)     Status: None   Collection Time    02/01/13  9:20 AM      Result Value Range   Order Confirmation ORDER PROCESSED BY BLOOD BANK     GLUCOSE, CAPILLARY     Status: Abnormal   Collection Time    02/01/13 11:49 AM      Result Value Range   Glucose-Capillary 240 (*) 70 - 99 mg/dL   Comment 1 Notify RN     Comment 2 Documented in Chart    GLUCOSE,  CAPILLARY     Status: Abnormal   Collection Time    02/01/13  5:02 PM      Result Value Range   Glucose-Capillary 156 (*) 70 - 99 mg/dL   Comment 1 Notify RN     Comment 2 Documented in Chart    GLUCOSE, CAPILLARY     Status: Abnormal   Collection Time    02/01/13  9:44 PM      Result Value Range   Glucose-Capillary 194 (*) 70 - 99 mg/dL  RENAL FUNCTION PANEL     Status: Abnormal   Collection Time    02/02/13  5:20 AM      Result Value Range   Sodium 135  135 - 145 mEq/L   Potassium 3.7  3.5 - 5.1 mEq/L   Chloride 93 (*) 96 - 112 mEq/L   CO2 23  19 - 32 mEq/L   Glucose, Bld 149 (*) 70 - 99 mg/dL   BUN 62 (*) 6 - 23 mg/dL   Creatinine, Ser 82.95 (*) 0.50 - 1.35 mg/dL   Calcium 8.4  8.4 - 62.1 mg/dL   Phosphorus 4.8 (*) 2.3 - 4.6 mg/dL   Albumin 2.1 (*) 3.5 - 5.2 g/dL   GFR calc non Af Amer 3 (*) >90 mL/min   GFR calc Af Amer 4 (*) >90 mL/min   Comment:            The eGFR has been calculated     using the CKD EPI equation.     This calculation has not been     validated in all clinical     situations.     eGFR's persistently     <90 mL/min signify     possible Chronic Kidney Disease.  CBC     Status: Abnormal   Collection Time    02/02/13  5:20 AM      Result Value Range   WBC 6.0  4.0 - 10.5 K/uL   RBC 2.77 (*) 4.22 - 5.81 MIL/uL   Hemoglobin 8.6 (*) 13.0 - 17.0 g/dL   HCT 30.8 (*) 65.7 - 84.6 %   MCV 88.8  78.0 - 100.0 fL   MCH 31.0  26.0 - 34.0 pg   MCHC 35.0  30.0 - 36.0 g/dL   RDW 96.2 (*) 95.2 - 84.1 %   Platelets 141 (*) 150 - 400 K/uL  GLUCOSE, CAPILLARY     Status: Abnormal   Collection Time    02/02/13  8:01 AM      Result Value Range   Glucose-Capillary 148 (*) 70 - 99 mg/dL   Comment 1 Documented in Chart     Comment 2 Notify RN        HPI :  67 y/o male with hx of HIV, recurrent Fe def anemia requiring multiple admission and mutliple transfusions > 3 in the last 6 months. According to the MR he was admitted in 11/13 with low Hgb and had CD4 of 160 - was sent out on bactrim prophylaxis. He has since had colonscopy and endoscopy (both done at the [place] in [place]) as well as a cameral pill study which he reports showed esophageal oozing / small bowel oozing which was resulting in multiple locations of GI bleeding. colonscopy was reported as normal. He has been taking his meds other than bactrim which he doesn't take anymore, has taken GI antacid meds but cannot tell me the name of his meds and didn't bring them with him. He  has a history of anal cancer status postchemotherapy and a half years ago. He is being followed by hematologist in the outpatient setting and was told that his anemia is likely secondary to end-stage renal disease   HOSPITAL COURSE:  Iron deficiency anemia  Hemoglobin 8.6  after 3 units of packed red blood cells,  Continue Lasix  Started on Protonix PO  Recent workup at [facility] therefore no indication for stat GI workup  Patient also has HIV,  CD4 count pending ,  Iron studies normal, received aranesp last week , THERFORE NOT REPATED  No history of lymphoma   CBC WEEKELY   End-stage renal disease on peritoneal dialysis  Will notify nephrology  CCPD, 5 overnight exchanges, no daytime dwell, 2.2 L per exchange, usually 1.5% and 2.5% dextrose  Continue home regimen   HIV/AIDS  No longer on PCP prophylaxis , follow up with ID outpt  CD4 cell count pending  Continue outpatient medications  Followup with the [facility]     Discharge Exam:  Blood pressure 134/73, pulse 77, temperature 98.2 F (36.8 C), temperature source Oral, resp. rate 16, height 5' 11.5" (1.816 m), weight 99.2 kg (218 lb 11.1 oz), SpO2 100.00%.    Cardiovascular: Normal rate, regular rhythm, normal heart sounds and  intact distal pulses.  Pulmonary/Chest: Effort normal and breath sounds normal. No respiratory distress.  Abdominal: Soft. Normal appearance and bowel sounds are normal. She exhibits no distension. There is no tenderness.  Musculoskeletal: She exhibits no edema and no tenderness.  Neurological: She is alert. No cranial nerve deficit      Signed: Anyra Kaufman 02/02/2013, 9:54 AM

## 2013-02-02 NOTE — Discharge Summary (Addendum)
Richarda Overlie, MD Physician Signed Internal Medicine Progress Notes Service date: 02/02/2013 9:54 AM   Physician Discharge Summary  Benjamin Long  MRN: [id number]  DOB/AGE: Dec 06, 1945 67 y.o.  PCP: Evlyn Courier, MD  Admit date: 01/31/2013  Discharge date: 02/02/2013  Discharge Diagnoses:  Anemia of chronic disease s/p 3 units  ESRD on peritoneal dialysis  HIV disease  Hypertension  DM type 2 causing renal disease  Iron (Fe) deficiency anemia  Anemia  ESRD (end stage renal disease)       Medication List       STOP taking these medications         aspirin EC 81 MG tablet       TAKE these medications         allopurinol 100 MG tablet     Commonly known as: ZYLOPRIM     Take 100 mg by mouth daily.     calcium acetate 667 MG capsule     Commonly known as: PHOSLO     Take 2,668 mg by mouth 3 (three) times daily with meals. Takes 4 capsules     docusate sodium 100 MG capsule     Commonly known as: COLACE     Take 100 mg by mouth 3 (three) times daily.     ferrous sulfate 325 (65 FE) MG tablet     Take 325 mg by mouth 3 (three) times daily with meals.     fosamprenavir 700 MG tablet     Commonly known as: LEXIVA     Take 700 mg by mouth 2 (two) times daily with a meal.     furosemide 80 MG tablet     Commonly known as: LASIX     Take 80 mg by mouth 2 (two) times daily.     gabapentin 100 MG capsule     Commonly known as: NEURONTIN     Take 100 mg by mouth daily.     insulin glargine 100 UNIT/ML injection     Commonly known as: LANTUS     Inject 28 Units into the skin every morning.     insulin regular 100 units/mL injection     Commonly known as: NOVOLIN R,HUMULIN R     Inject 14-22 Units into the skin See admin instructions. Pt takes anywhere from 14-22 units 3 times daily as directed per sliding scale. Pt also takes 18 units at night prior to home dialysis.     latanoprost 0.005 % ophthalmic solution     Commonly known as: XALATAN     Place 1 drop into both eyes at  bedtime.     loratadine 10 MG tablet     Commonly known as: CLARITIN     Take 10 mg by mouth daily.     metoCLOPramide 5 MG tablet     Commonly known as: REGLAN     Take 5 mg by mouth 3 (three) times daily.     multivitamin Tabs tablet     Take 1 tablet by mouth daily.     pantoprazole 40 MG tablet     Commonly known as: PROTONIX     Take 1 tablet (40 mg total) by mouth daily.     polyvinyl alcohol 1.4 % ophthalmic solution     Commonly known as: LIQUIFILM TEARS     Place 1 drop into both eyes 4 (four) times daily.     pravastatin 20 MG tablet     Commonly known as: PRAVACHOL  Take 1 tablet (20 mg total) by mouth daily at 6 PM.     raltegravir 400 MG tablet     Commonly known as: ISENTRESS     Take 400 mg by mouth 2 (two) times daily.     ritonavir 100 MG capsule     Commonly known as: NORVIR     Take 100 mg by mouth 2 (two) times daily.     sildenafil 50 MG tablet     Commonly known as: VIAGRA     Take 50 mg by mouth daily as needed. For erectile dysfunction     tenofovir 300 MG tablet     Commonly known as: VIREAD     Take 300 mg by mouth every 7 (seven) days. Take on Tuesdays after hemodialysis.        Discharge Condition: stable  Disposition: 06-Home-Health Care Svc  Consults:  nephrology  Significant Diagnostic Studies:  Microbiology:  No results found for this or any previous visit (from the past 240 hour(s)).  Labs:    Results for orders placed during the hospital encounter of 01/31/13 (from the past 48 hour(s))    CBC WITH DIFFERENTIAL Status: Abnormal     Collection Time     01/31/13 4:00 PM    Result  Value  Range     WBC  9.2  4.0 - 10.5 K/uL     RBC  1.90 (*)  4.22 - 5.81 MIL/uL     Hemoglobin  6.0 (*)  13.0 - 17.0 g/dL     Comment:  REPEATED TO VERIFY      CRITICAL RESULT CALLED TO, READ BACK BY AND VERIFIED WITH:      CRYSTAL ADKINS RN AT 1705 01/31/13 BY WOOLLENK     HCT  17.8 (*)  39.0 - 52.0 %     MCV  93.7  78.0 - 100.0 fL     MCH  31.6  26.0  - 34.0 pg     MCHC  33.7  30.0 - 36.0 g/dL     RDW  21.3 (*)  08.6 - 15.5 %     Platelets  169  150 - 400 K/uL     Neutrophils Relative  64  43 - 77 %     Neutro Abs  5.8  1.7 - 7.7 K/uL     Band Neutrophils  0  0 - 10 %     Lymphocytes Relative  24  12 - 46 %     Lymphs Abs  2.2  0.7 - 4.0 K/uL     Monocytes Relative  9  3 - 12 %     Monocytes Absolute  0.8  0.1 - 1.0 K/uL     Eosinophils Relative  3  0 - 5 %     Eosinophils Absolute  0.3  0.0 - 0.7 K/uL     Basophils Relative  0  0 - 1 %     Basophils Absolute  0  0.0 - 0.1 K/uL     LUCs, %  0  0 - 4 %     LUC, Absolute  0  0.0 - 0.5 K/uL    COMPREHENSIVE METABOLIC PANEL Status: Abnormal     Collection Time     01/31/13 4:00 PM    Result  Value  Range     Sodium  136  135 - 145 mEq/L     Potassium  3.7  3.5 - 5.1 mEq/L  Chloride  94 (*)  96 - 112 mEq/L     CO2  23  19 - 32 mEq/L     Glucose, Bld  110 (*)  70 - 99 mg/dL     BUN  56 (*)  6 - 23 mg/dL     Creatinine, Ser  16.10 (*)  0.50 - 1.35 mg/dL     Calcium  8.8  8.4 - 10.5 mg/dL     Total Protein  7.2  6.0 - 8.3 g/dL     Albumin  2.4 (*)  3.5 - 5.2 g/dL     AST  25  0 - 37 U/L     ALT  12  0 - 53 U/L     Alkaline Phosphatase  111  39 - 117 U/L     Total Bilirubin  0.3  0.3 - 1.2 mg/dL     GFR calc non Af Amer  3 (*)  >90 mL/min     GFR calc Af Amer  3 (*)  >90 mL/min     Comment:       The eGFR has been calculated      using the CKD EPI equation.      This calculation has not been      validated in all clinical      situations.      eGFR's persistently      <90 mL/min signify      possible Chronic Kidney Disease.    APTT Status: Abnormal     Collection Time     01/31/13 4:00 PM    Result  Value  Range     aPTT  43 (*)  24 - 37 seconds     Comment:       IF BASELINE aPTT IS ELEVATED,      SUGGEST PATIENT RISK ASSESSMENT      BE USED TO DETERMINE APPROPRIATE      ANTICOAGULANT THERAPY.    PROTIME-INR Status: None     Collection Time     01/31/13 4:00 PM     Result  Value  Range     Prothrombin Time  14.7  11.6 - 15.2 seconds     INR  1.17  0.00 - 1.49    TYPE AND SCREEN Status: None     Collection Time     01/31/13 4:00 PM    Result  Value  Range     ABO/RH(D)  O POS      Antibody Screen  NEG      Sample Expiration  02/03/2013      Unit Number  [id number]      Blood Component Type  RBC CPDA1, LR      Unit division  00      Status of Unit  ISSUED,FINAL      Transfusion Status  OK TO TRANSFUSE      Crossmatch Result  Compatible      Unit Number  [id number]      Blood Component Type  RBC CPDA1, LR      Unit division  00      Status of Unit  ISSUED,FINAL      Transfusion Status  OK TO TRANSFUSE      Crossmatch Result  Compatible      Unit Number  [id number]      Blood Component Type  RED CELLS,LR      Unit division  00      Status of Unit  ISSUED,FINAL      Transfusion Status  OK TO TRANSFUSE      Crossmatch Result  Compatible     VITAMIN B12 Status: Abnormal     Collection Time     01/31/13 4:00 PM    Result  Value  Range     Vitamin B-12  1783 (*)  211 - 911 pg/mL    FOLATE Status: None     Collection Time     01/31/13 4:00 PM    Result  Value  Range     Folate  >20.0      Comment:  (NOTE)      Reference Ranges      Deficient: 0.4 - 3.3 ng/mL      Indeterminate: 3.4 - 5.4 ng/mL      Normal: > 5.4 ng/mL    IRON AND TIBC Status: Abnormal     Collection Time     01/31/13 4:00 PM    Result  Value  Range     Iron  44  42 - 135 ug/dL     TIBC  161  096 - 045 ug/dL     Saturation Ratios  15 (*)  20 - 55 %     UIBC  243  125 - 400 ug/dL    FERRITIN Status: None     Collection Time     01/31/13 4:00 PM    Result  Value  Range     Ferritin  62  22 - 322 ng/mL    RETICULOCYTES Status: Abnormal     Collection Time     01/31/13 4:00 PM    Result  Value  Range     Retic Ct Pct  4.0 (*)  0.4 - 3.1 %     RBC.  1.90 (*)  4.22 - 5.81 MIL/uL     Retic Count, Manual  76.0  19.0 - 186.0 K/uL    POCT I-STAT, CHEM 8  Status: Abnormal     Collection Time     01/31/13 4:19 PM    Result  Value  Range     Sodium  137  135 - 145 mEq/L     Potassium  3.7  3.5 - 5.1 mEq/L     Chloride  99  96 - 112 mEq/L     BUN  55 (*)  6 - 23 mg/dL     Creatinine, Ser  40.98 (*)  0.50 - 1.35 mg/dL     Glucose, Bld  119 (*)  70 - 99 mg/dL     Calcium, Ion  1.47 (*)  1.13 - 1.30 mmol/L     TCO2  23  0 - 100 mmol/L     Hemoglobin  6.8 (*)  13.0 - 17.0 g/dL     HCT  82.9 (*)  56.2 - 52.0 %     Comment  NOTIFIED PHYSICIAN     PREPARE RBC (CROSSMATCH) Status: None     Collection Time     01/31/13 5:15 PM    Result  Value  Range     Order Confirmation  ORDER PROCESSED BY BLOOD BANK     TROPONIN I Status: None     Collection Time     01/31/13 6:37 PM    Result  Value  Range     Troponin I  <0.30  <0.30 ng/mL     Comment:       Due to the release kinetics of cTnI,  a negative result within the first hours      of the onset of symptoms does not rule out      myocardial infarction with certainty.      If myocardial infarction is still suspected,      repeat the test at appropriate intervals.    VITAMIN B12 Status: Abnormal     Collection Time     01/31/13 6:37 PM    Result  Value  Range     Vitamin B-12  1669 (*)  211 - 911 pg/mL    FOLATE Status: None     Collection Time     01/31/13 6:37 PM    Result  Value  Range     Folate  >20.0      Comment:  (NOTE)      Reference Ranges      Deficient: 0.4 - 3.3 ng/mL      Indeterminate: 3.4 - 5.4 ng/mL      Normal: > 5.4 ng/mL    IRON AND TIBC Status: None     Collection Time     01/31/13 6:37 PM    Result  Value  Range     Iron  64  42 - 135 ug/dL     TIBC  161  096 - 045 ug/dL     Saturation Ratios  24  20 - 55 %     UIBC  208  125 - 400 ug/dL    FERRITIN Status: None     Collection Time     01/31/13 6:37 PM    Result  Value  Range     Ferritin  57  22 - 322 ng/mL    RETICULOCYTES Status: Abnormal     Collection Time     01/31/13 6:37 PM    Result   Value  Range     Retic Ct Pct  3.5 (*)  0.4 - 3.1 %     RBC.  1.83 (*)  4.22 - 5.81 MIL/uL     Retic Count, Manual  64.1  19.0 - 186.0 K/uL    TSH Status: None     Collection Time     01/31/13 6:37 PM    Result  Value  Range     TSH  4.004  0.350 - 4.500 uIU/mL    HEMOGLOBIN A1C Status: Abnormal     Collection Time     01/31/13 6:37 PM    Result  Value  Range     Hemoglobin A1C  6.6 (*)  <5.7 %     Comment:  (NOTE)           According to the ADA Clinical Practice Recommendations for 2011, when      HbA1c is used as a screening test:      >=6.5% Diagnostic of Diabetes Mellitus      (if abnormal result is confirmed)      5.7-6.4% Increased risk of developing Diabetes Mellitus      References:Diagnosis and Classification of Diabetes Mellitus,Diabetes      Care,2011,34(Suppl 1):S62-S69 and Standards of Medical Care in      Diabetes - 2011,Diabetes Care,2011,34 (Suppl 1):S11-S61.     Mean Plasma Glucose  143 (*)  <117 mg/dL    PHOSPHORUS Status: Abnormal     Collection Time     01/31/13 6:37 PM    Result  Value  Range     Phosphorus  5.6 (*)  2.3 - 4.6 mg/dL  GLUCOSE, CAPILLARY Status: None     Collection Time     01/31/13 8:12 PM    Result  Value  Range     Glucose-Capillary  92  70 - 99 mg/dL    CBC Status: Abnormal     Collection Time     02/01/13 12:30 AM    Result  Value  Range     WBC  6.5  4.0 - 10.5 K/uL     RBC  1.98 (*)  4.22 - 5.81 MIL/uL     Hemoglobin  6.1 (*)  13.0 - 17.0 g/dL     Comment:  CRITICAL VALUE NOTED. VALUE IS CONSISTENT WITH PREVIOUSLY REPORTED AND CALLED VALUE.     HCT  18.4 (*)  39.0 - 52.0 %     MCV  92.9  78.0 - 100.0 fL     MCH  30.8  26.0 - 34.0 pg     MCHC  33.2  30.0 - 36.0 g/dL     RDW  30.8  65.7 - 84.6 %     Platelets  133 (*)  150 - 400 K/uL    TROPONIN I Status: None     Collection Time     02/01/13 12:31 AM    Result  Value  Range     Troponin I  <0.30  <0.30 ng/mL     Comment:       Due to the release kinetics of cTnI,       a negative result within the first hours      of the onset of symptoms does not rule out      myocardial infarction with certainty.      If myocardial infarction is still suspected,      repeat the test at appropriate intervals.    COMPREHENSIVE METABOLIC PANEL Status: Abnormal     Collection Time     02/01/13 4:35 AM    Result  Value  Range     Sodium  136  135 - 145 mEq/L     Potassium  3.9  3.5 - 5.1 mEq/L     Chloride  95 (*)  96 - 112 mEq/L     CO2  23  19 - 32 mEq/L     Glucose, Bld  175 (*)  70 - 99 mg/dL     BUN  59 (*)  6 - 23 mg/dL     Creatinine, Ser  96.29 (*)  0.50 - 1.35 mg/dL     Calcium  8.2 (*)  8.4 - 10.5 mg/dL     Total Protein  6.8  6.0 - 8.3 g/dL     Albumin  2.2 (*)  3.5 - 5.2 g/dL     AST  24  0 - 37 U/L     ALT  12  0 - 53 U/L     Alkaline Phosphatase  138 (*)  39 - 117 U/L     Total Bilirubin  0.6  0.3 - 1.2 mg/dL     GFR calc non Af Amer  3 (*)  >90 mL/min     GFR calc Af Amer  3 (*)  >90 mL/min     Comment:       The eGFR has been calculated      using the CKD EPI equation.      This calculation has not been      validated in all clinical      situations.  eGFR's persistently      <90 mL/min signify      possible Chronic Kidney Disease.    TROPONIN I Status: None     Collection Time     02/01/13 4:35 AM    Result  Value  Range     Troponin I  <0.30  <0.30 ng/mL     Comment:       Due to the release kinetics of cTnI,      a negative result within the first hours      of the onset of symptoms does not rule out      myocardial infarction with certainty.      If myocardial infarction is still suspected,      repeat the test at appropriate intervals.    CBC Status: Abnormal     Collection Time     02/01/13 4:35 AM    Result  Value  Range     WBC  6.5  4.0 - 10.5 K/uL     RBC  2.50 (*)  4.22 - 5.81 MIL/uL     Hemoglobin  7.7 (*)  13.0 - 17.0 g/dL     Comment:  POST TRANSFUSION SPECIMEN     HCT  22.5 (*)  39.0 - 52.0 %     MCV  90.0  78.0  - 100.0 fL     MCH  30.8  26.0 - 34.0 pg     MCHC  34.2  30.0 - 36.0 g/dL     RDW  16.1 (*)  09.6 - 15.5 %     Platelets  154  150 - 400 K/uL    GLUCOSE, CAPILLARY Status: Abnormal     Collection Time     02/01/13 8:16 AM    Result  Value  Range     Glucose-Capillary  143 (*)  70 - 99 mg/dL    PREPARE RBC (CROSSMATCH) Status: None     Collection Time     02/01/13 9:20 AM    Result  Value  Range     Order Confirmation  ORDER PROCESSED BY BLOOD BANK     GLUCOSE, CAPILLARY Status: Abnormal     Collection Time     02/01/13 11:49 AM    Result  Value  Range     Glucose-Capillary  240 (*)  70 - 99 mg/dL     Comment 1  Notify RN      Comment 2  Documented in Chart     GLUCOSE, CAPILLARY Status: Abnormal     Collection Time     02/01/13 5:02 PM    Result  Value  Range     Glucose-Capillary  156 (*)  70 - 99 mg/dL     Comment 1  Notify RN      Comment 2  Documented in Chart     GLUCOSE, CAPILLARY Status: Abnormal     Collection Time     02/01/13 9:44 PM    Result  Value  Range     Glucose-Capillary  194 (*)  70 - 99 mg/dL    RENAL FUNCTION PANEL Status: Abnormal     Collection Time     02/02/13 5:20 AM    Result  Value  Range     Sodium  135  135 - 145 mEq/L     Potassium  3.7  3.5 - 5.1 mEq/L     Chloride  93 (*)  96 - 112 mEq/L  CO2  23  19 - 32 mEq/L     Glucose, Bld  149 (*)  70 - 99 mg/dL     BUN  62 (*)  6 - 23 mg/dL     Creatinine, Ser  16.10 (*)  0.50 - 1.35 mg/dL     Calcium  8.4  8.4 - 10.5 mg/dL     Phosphorus  4.8 (*)  2.3 - 4.6 mg/dL     Albumin  2.1 (*)  3.5 - 5.2 g/dL     GFR calc non Af Amer  3 (*)  >90 mL/min     GFR calc Af Amer  4 (*)  >90 mL/min     Comment:       The eGFR has been calculated      using the CKD EPI equation.      This calculation has not been      validated in all clinical      situations.      eGFR's persistently      <90 mL/min signify      possible Chronic Kidney Disease.    CBC Status: Abnormal     Collection Time      02/02/13 5:20 AM    Result  Value  Range     WBC  6.0  4.0 - 10.5 K/uL     RBC  2.77 (*)  4.22 - 5.81 MIL/uL     Hemoglobin  8.6 (*)  13.0 - 17.0 g/dL     HCT  96.0 (*)  45.4 - 52.0 %     MCV  88.8  78.0 - 100.0 fL     MCH  31.0  26.0 - 34.0 pg     MCHC  35.0  30.0 - 36.0 g/dL     RDW  09.8 (*)  11.9 - 15.5 %     Platelets  141 (*)  150 - 400 K/uL    GLUCOSE, CAPILLARY Status: Abnormal     Collection Time     02/02/13 8:01 AM    Result  Value  Range     Glucose-Capillary  148 (*)  70 - 99 mg/dL     Comment 1  Documented in Chart      Comment 2  Notify RN      HPI :  67 y/o male with hx of HIV, recurrent Fe def anemia requiring multiple admission and mutliple transfusions > 3 in the last 6 months. According to the MR he was admitted in 11/13 with low Hgb and had CD4 of 160 - was sent out on bactrim prophylaxis. He has since had colonscopy and endoscopy (both done at the [facility] in [place]) as well as a cameral pill study which he reports showed esophageal oozing / small bowel oozing which was resulting in multiple locations of GI bleeding. colonscopy was reported as normal. He has been taking his meds other than bactrim which he doesn't take anymore, has taken GI antacid meds but cannot tell me the name of his meds and didn't bring them with him. He has a history of anal cancer status postchemotherapy and a half years ago. He is being followed by hematologist in the outpatient setting and was told that his anemia is likely secondary to end-stage renal disease  HOSPITAL COURSE:  Iron deficiency anemia  Hemoglobin 8.6 after 3 units of packed red blood cells,  Continue Lasix  Started on Protonix PO  Recent workup at the [facility] therefore no  indication for stat GI workup  Patient also has HIV, CD4 count pending ,  Iron studies normal, received aranesp last week , THERFORE NOT REPATED  No history of lymphoma  CBC WEEKELY  End-stage renal disease on peritoneal dialysis  Will notify nephrology   CCPD, 5 overnight exchanges, no daytime dwell, 2.2 L per exchange, usually 1.5% and 2.5% dextrose  Continue home regimen  HIV/AIDS  No longer on PCP prophylaxis , follow up with ID outpt  CD4 cell count pending  Continue outpatient medications  Followup with the [facility]    Discharge Exam:  Blood pressure 134/73, pulse 77, temperature 98.2 F (36.8 C), temperature source Oral, resp. rate 16, height 5' 11.5" (1.816 m), weight 99.2 kg (218 lb 11.1 oz), SpO2 100.00%.  Cardiovascular: Normal rate, regular rhythm, normal heart sounds and intact distal pulses.  Pulmonary/Chest: Effort normal and breath sounds normal. No respiratory distress.  Abdominal: Soft. Normal appearance and bowel sounds are normal. She exhibits no distension. There is no tenderness.  Musculoskeletal: She exhibits no edema and no tenderness.  Neurological: She is alert. No cranial nerve deficit    Signed:  Jarry Manon  02/02/2013, 9:54 AM

## 2013-04-28 ENCOUNTER — Emergency Department (HOSPITAL_COMMUNITY)
Admission: EM | Admit: 2013-04-28 | Discharge: 2013-04-29 | Disposition: A | Discharge status: Home / Self Care | Payer: Medicare PPO | Attending: Emergency Medicine | Admitting: Emergency Medicine

## 2013-04-28 ENCOUNTER — Encounter (HOSPITAL_COMMUNITY): Payer: Self-pay | Admitting: Emergency Medicine

## 2013-04-28 DIAGNOSIS — E1129 Type 2 diabetes mellitus with other diabetic kidney complication: Secondary | ICD-10-CM | POA: Insufficient documentation

## 2013-04-28 DIAGNOSIS — R42 Dizziness and giddiness: Secondary | ICD-10-CM | POA: Insufficient documentation

## 2013-04-28 DIAGNOSIS — G709 Myoneural disorder, unspecified: Secondary | ICD-10-CM | POA: Insufficient documentation

## 2013-04-28 DIAGNOSIS — Z85048 Personal history of other malignant neoplasm of rectum, rectosigmoid junction, and anus: Secondary | ICD-10-CM | POA: Insufficient documentation

## 2013-04-28 DIAGNOSIS — I10 Essential (primary) hypertension: Secondary | ICD-10-CM | POA: Insufficient documentation

## 2013-04-28 DIAGNOSIS — R531 Weakness: Secondary | ICD-10-CM

## 2013-04-28 DIAGNOSIS — I120 Hypertensive chronic kidney disease with stage 5 chronic kidney disease or end stage renal disease: Secondary | ICD-10-CM | POA: Insufficient documentation

## 2013-04-28 DIAGNOSIS — F17200 Nicotine dependence, unspecified, uncomplicated: Secondary | ICD-10-CM | POA: Insufficient documentation

## 2013-04-28 DIAGNOSIS — R5383 Other fatigue: Secondary | ICD-10-CM | POA: Insufficient documentation

## 2013-04-28 DIAGNOSIS — R5381 Other malaise: Secondary | ICD-10-CM | POA: Insufficient documentation

## 2013-04-28 DIAGNOSIS — Z79899 Other long term (current) drug therapy: Secondary | ICD-10-CM | POA: Insufficient documentation

## 2013-04-28 DIAGNOSIS — Z794 Long term (current) use of insulin: Secondary | ICD-10-CM | POA: Insufficient documentation

## 2013-04-28 DIAGNOSIS — Z8619 Personal history of other infectious and parasitic diseases: Secondary | ICD-10-CM | POA: Insufficient documentation

## 2013-04-28 DIAGNOSIS — D509 Iron deficiency anemia, unspecified: Secondary | ICD-10-CM | POA: Insufficient documentation

## 2013-04-28 DIAGNOSIS — Z992 Dependence on renal dialysis: Secondary | ICD-10-CM | POA: Insufficient documentation

## 2013-04-28 DIAGNOSIS — B20 Human immunodeficiency virus [HIV] disease: Secondary | ICD-10-CM | POA: Insufficient documentation

## 2013-04-28 LAB — COMPREHENSIVE METABOLIC PANEL
BUN: 43 mg/dL — ABNORMAL HIGH (ref 6–23)
CO2: 22 mEq/L (ref 19–32)
Calcium: 8.7 mg/dL (ref 8.4–10.5)
Creatinine, Ser: 12.71 mg/dL — ABNORMAL HIGH (ref 0.50–1.35)
GFR calc Af Amer: 4 mL/min — ABNORMAL LOW (ref >90)
GFR calc non Af Amer: 3 mL/min — ABNORMAL LOW (ref >90)
Glucose, Bld: 113 mg/dL — ABNORMAL HIGH (ref 70–99)
Total Protein: 7.2 g/dL (ref 6.0–8.3)

## 2013-04-28 LAB — CBC WITH DIFFERENTIAL/PLATELET
Eosinophils Absolute: 0.4 10*3/uL (ref 0.0–0.7)
Eosinophils Relative: 4 % (ref 0–5)
HCT: 27.4 % — ABNORMAL LOW (ref 39.0–52.0)
Lymphocytes Relative: 28 % (ref 12–46)
Lymphs Abs: 2.4 10*3/uL (ref 0.7–4.0)
MCH: 31.8 pg (ref 26.0–34.0)
MCV: 95.8 fL (ref 78.0–100.0)
Monocytes Absolute: 1 10*3/uL (ref 0.1–1.0)
RBC: 2.86 MIL/uL — ABNORMAL LOW (ref 4.22–5.81)
RDW: 18.1 % — ABNORMAL HIGH (ref 11.5–15.5)
WBC: 8.3 10*3/uL (ref 4.0–10.5)

## 2013-04-28 LAB — APTT: aPTT: 40 seconds — ABNORMAL HIGH (ref 24–37)

## 2013-04-28 LAB — PROTIME-INR
INR: 1.26 (ref 0.00–1.49)
Prothrombin Time: 15.5 seconds — ABNORMAL HIGH (ref 11.6–15.2)

## 2013-04-28 LAB — OCCULT BLOOD, POC DEVICE: Fecal Occult Bld: NEGATIVE

## 2013-04-28 MED ORDER — SODIUM CHLORIDE 0.9 % IV BOLUS (SEPSIS)
250.0000 mL | Freq: Once | INTRAVENOUS | Status: AC
  Administered 2013-04-28: 250 mL via INTRAVENOUS

## 2013-04-28 NOTE — ED Provider Notes (Signed)
History    CSN: [id number] Arrival date & time 04/28/13  4098  First MD Initiated Contact with Patient 04/28/13 1918     Chief Complaint  Patient presents with  . Weakness  . Anemia   (Consider location/radiation/quality/duration/timing/severity/associated sxs/prior Treatment) The history is provided by the patient, a relative and medical records.   Presents with past medical history significant for ESRD, HIV, hypertension, DM 2, hepatitis B, presents to the ED for increased weakness, lightheadedness, and fatigue increasing over the past 2 days. Patient is a daily PD patient with a long history of Fe+ def anemia and GI bleeds requiring blood transfusions, most recent 01/31/13.   Pt states these sx are the exact same as before his prior admission.  Denies seeing any blood in his stools but states "they always find it in there."  Has undergone capsule endoscopy at the [facility] hospital- several areas of bleeding throughout the bowel.  Denies any chest pain, palpitations, SOB, numbness/paresthesias of extremities, abdominal pain, nausea, vomiting, or diarrhea.  BM normal recently, but none today so far.  Has been eating and drinking normally.  No recent fevers, sweats, or chills.  Taking all medications as directed.  Past Medical History  Diagnosis Date  . ESRD on peritoneal dialysis   . HIV disease   . Hypertension   . Shortness of breath   . DM type 2 causing renal disease   . Neuromuscular disorder     tremers  . Cancer     hx of rectal cancer  . Anemia 09/02/2012  . Hepatitis     hx of hepatitis B  . Iron (Fe) deficiency anemia 09/02/2012   Past Surgical History  Procedure Laterality Date  . Hemorroidectomy    . Insertion of dialysis catheter    . Hernia repair      TESTICLE   History reviewed. No pertinent family history. History  Substance Use Topics  . Smoking status: Current Every Day Smoker -- 0.25 packs/day for 50 years    Types: Cigarettes  . Smokeless tobacco: Never Used   . Alcohol Use: No    Review of Systems  Constitutional: Positive for fatigue.  Neurological: Positive for weakness and light-headedness.  All other systems reviewed and are negative.    Allergies  Codeine  Home Medications   Current Outpatient Rx  Name  Route  Sig  Dispense  Refill  . allopurinol (ZYLOPRIM) 100 MG tablet   Oral   Take 100 mg by mouth daily.         . calcium acetate (PHOSLO) 667 MG capsule   Oral   Take 2,668 mg by mouth 3 (three) times daily with meals. Takes 4 capsules         . docusate sodium (COLACE) 100 MG capsule   Oral   Take 100 mg by mouth 3 (three) times daily.          . ferrous sulfate 325 (65 FE) MG tablet   Oral   Take 325 mg by mouth 3 (three) times daily with meals.          . fosamprenavir (LEXIVA) 700 MG tablet   Oral   Take 700 mg by mouth 2 (two) times daily with a meal.         . furosemide (LASIX) 80 MG tablet   Oral   Take 80 mg by mouth 2 (two) times daily.         Marland Kitchen gabapentin (NEURONTIN) 100 MG capsule  Oral   Take 100 mg by mouth daily.         . insulin glargine (LANTUS) 100 UNIT/ML injection   Subcutaneous   Inject 28 Units into the skin every morning.         . insulin regular (NOVOLIN R,HUMULIN R) 100 units/mL injection   Subcutaneous   Inject 14-22 Units into the skin See admin instructions. Pt takes anywhere from 14-22 units 3 times daily as directed per sliding scale. Pt also takes 18 units at night prior to home dialysis.         Marland Kitchen latanoprost (XALATAN) 0.005 % ophthalmic solution   Both Eyes   Place 1 drop into both eyes at bedtime.         Marland Kitchen loratadine (CLARITIN) 10 MG tablet   Oral   Take 10 mg by mouth daily.         . metoCLOPramide (REGLAN) 5 MG tablet   Oral   Take 5 mg by mouth 3 (three) times daily.         . multivitamin (RENA-VIT) TABS tablet   Oral   Take 1 tablet by mouth daily.         . pantoprazole (PROTONIX) 40 MG tablet   Oral   Take 1 tablet (40  mg total) by mouth daily.   30 tablet   0   . polyvinyl alcohol (LIQUIFILM TEARS) 1.4 % ophthalmic solution   Both Eyes   Place 1 drop into both eyes 4 (four) times daily.         . pravastatin (PRAVACHOL) 20 MG tablet   Oral   Take 1 tablet (20 mg total) by mouth daily at 6 PM.   30 tablet   0   . raltegravir (ISENTRESS) 400 MG tablet   Oral   Take 400 mg by mouth 2 (two) times daily.         . ritonavir (NORVIR) 100 MG capsule   Oral   Take 100 mg by mouth 2 (two) times daily.         . sildenafil (VIAGRA) 50 MG tablet   Oral   Take 50 mg by mouth daily as needed. For erectile dysfunction         . tenofovir (VIREAD) 300 MG tablet   Oral   Take 300 mg by mouth every 7 (seven) days. Take on Tuesdays after hemodialysis.          BP 94/60  Pulse 93  Resp 18  SpO2 100%  Physical Exam  Nursing note and vitals reviewed. Constitutional: He is oriented to person, place, and time. He appears well-developed and well-nourished.  HENT:  Head: Normocephalic and atraumatic.  Mouth/Throat: Oropharynx is clear and moist.  Eyes: Conjunctivae and EOM are normal. Pupils are equal, round, and reactive to light.  Neck: Normal range of motion.  Cardiovascular: Normal rate, regular rhythm and normal heart sounds.   Pulmonary/Chest: Effort normal and breath sounds normal. No respiratory distress. He has no wheezes.  Abdominal: Soft. Bowel sounds are normal. There is no tenderness. There is no guarding.  Peritoneal dialysis catheter in place on right side of abdomen, no surrounding erythema or drainage  Genitourinary: Rectum normal. Rectal exam shows no external hemorrhoid, no mass and anal tone normal. Guaiac negative stool.  Musculoskeletal: Normal range of motion. He exhibits no edema.  Neurological: He is alert and oriented to person, place, and time. He has normal strength. No cranial nerve deficit or sensory deficit.  CN grossly intact, moves all extremities appropriately,  equal strength UE and LE bilaterally, no focal neuro deficits or facial droop appreciated  Skin: Skin is warm and dry.  Psychiatric: He has a normal mood and affect.    ED Course  Procedures (including critical care time)   Date: 04/29/2013  Rate: 88  Rhythm: normal sinus rhythm  QRS Axis: normal  Intervals: normal  ST/T Wave abnormalities: normal  Conduction Disutrbances:right bundle branch block  Narrative Interpretation: NSR, RBBB  Old EKG Reviewed: unchanged   Labs Reviewed  CBC WITH DIFFERENTIAL - Abnormal; Notable for the following:    RBC 2.86 (*)    Hemoglobin 9.1 (*)    HCT 27.4 (*)    RDW 18.1 (*)    Platelets 146 (*)    All other components within normal limits  COMPREHENSIVE METABOLIC PANEL - Abnormal; Notable for the following:    Sodium 128 (*)    Chloride 85 (*)    Glucose, Bld 113 (*)    BUN 43 (*)    Creatinine, Ser 12.71 (*)    Albumin 2.0 (*)    Alkaline Phosphatase 175 (*)    GFR calc non Af Amer 3 (*)    GFR calc Af Amer 4 (*)    All other components within normal limits  APTT - Abnormal; Notable for the following:    aPTT 40 (*)    All other components within normal limits  PROTIME-INR - Abnormal; Notable for the following:    Prothrombin Time 15.5 (*)    All other components within normal limits  OCCULT BLOOD, POC DEVICE  TYPE AND SCREEN   No results found.  1. Weakness   2. Fatigue     MDM   EKG NSR, no acute ischemic changes.  Trop negative.  Labs as above- H/H stable 9.1/27.4.  FOBT negative.  Chemistry consistent with dialysis.  No focal neuro deficits noted on PE.  Pt with + orthostatics VS.  Will give fluids and reassess.  12:25 AM 250cc bolus given.  BP improved.  Pt states he feels better than he did on arrival.  VS remain stable- ok for d/c.  FU with PCP within 1 week and discuss this visit.  Discussed plan with pt and son, they agreed.  Return precautions advised.  Discussed pt with Dr. Blinda Leatherwood who agrees with  plan.     Garlon Hatchet, PA-C 04/29/13 [phone]

## 2013-04-28 NOTE — ED Notes (Addendum)
Ambulated pt in the hallway saturations remained at 97% (on room air) while ambulating on the unit

## 2013-04-28 NOTE — ED Notes (Signed)
Pt c/o increased weakness and feeling shaky; pt is PD pt and sts thinks he is anemic and may need blood transfusion

## 2013-04-30 NOTE — ED Provider Notes (Signed)
Medical screening examination/treatment/procedure(s) were performed by non-physician practitioner and as supervising physician I was immediately available for consultation/collaboration.    Gilda Crease, MD 04/30/13 [phone]

## 2013-05-11 ENCOUNTER — Other Ambulatory Visit: Payer: Self-pay

## 2013-05-11 ENCOUNTER — Encounter (HOSPITAL_COMMUNITY): Payer: Self-pay | Admitting: Emergency Medicine

## 2013-05-11 ENCOUNTER — Emergency Department (HOSPITAL_COMMUNITY): Payer: Medicare PPO

## 2013-05-11 ENCOUNTER — Emergency Department (HOSPITAL_COMMUNITY)
Admission: EM | Admit: 2013-05-11 | Discharge: 2013-05-11 | Disposition: A | Discharge status: Home / Self Care | Payer: Medicare PPO | Attending: Emergency Medicine | Admitting: Emergency Medicine

## 2013-05-11 DIAGNOSIS — K759 Inflammatory liver disease, unspecified: Secondary | ICD-10-CM | POA: Insufficient documentation

## 2013-05-11 DIAGNOSIS — Z21 Asymptomatic human immunodeficiency virus [HIV] infection status: Secondary | ICD-10-CM | POA: Insufficient documentation

## 2013-05-11 DIAGNOSIS — R0602 Shortness of breath: Secondary | ICD-10-CM | POA: Insufficient documentation

## 2013-05-11 DIAGNOSIS — R5383 Other fatigue: Secondary | ICD-10-CM

## 2013-05-11 DIAGNOSIS — R531 Weakness: Secondary | ICD-10-CM

## 2013-05-11 DIAGNOSIS — F17200 Nicotine dependence, unspecified, uncomplicated: Secondary | ICD-10-CM | POA: Insufficient documentation

## 2013-05-11 DIAGNOSIS — Z885 Allergy status to narcotic agent status: Secondary | ICD-10-CM | POA: Insufficient documentation

## 2013-05-11 DIAGNOSIS — R197 Diarrhea, unspecified: Secondary | ICD-10-CM | POA: Insufficient documentation

## 2013-05-11 DIAGNOSIS — I959 Hypotension, unspecified: Secondary | ICD-10-CM | POA: Insufficient documentation

## 2013-05-11 DIAGNOSIS — Z992 Dependence on renal dialysis: Secondary | ICD-10-CM | POA: Insufficient documentation

## 2013-05-11 DIAGNOSIS — D509 Iron deficiency anemia, unspecified: Secondary | ICD-10-CM | POA: Insufficient documentation

## 2013-05-11 DIAGNOSIS — Z79899 Other long term (current) drug therapy: Secondary | ICD-10-CM | POA: Insufficient documentation

## 2013-05-11 DIAGNOSIS — N186 End stage renal disease: Secondary | ICD-10-CM | POA: Insufficient documentation

## 2013-05-11 DIAGNOSIS — D649 Anemia, unspecified: Secondary | ICD-10-CM | POA: Insufficient documentation

## 2013-05-11 DIAGNOSIS — N058 Unspecified nephritic syndrome with other morphologic changes: Secondary | ICD-10-CM | POA: Insufficient documentation

## 2013-05-11 DIAGNOSIS — K921 Melena: Secondary | ICD-10-CM | POA: Insufficient documentation

## 2013-05-11 DIAGNOSIS — Z85048 Personal history of other malignant neoplasm of rectum, rectosigmoid junction, and anus: Secondary | ICD-10-CM | POA: Insufficient documentation

## 2013-05-11 DIAGNOSIS — M6281 Muscle weakness (generalized): Secondary | ICD-10-CM | POA: Insufficient documentation

## 2013-05-11 DIAGNOSIS — R630 Anorexia: Secondary | ICD-10-CM | POA: Insufficient documentation

## 2013-05-11 DIAGNOSIS — Z794 Long term (current) use of insulin: Secondary | ICD-10-CM | POA: Insufficient documentation

## 2013-05-11 DIAGNOSIS — K644 Residual hemorrhoidal skin tags: Secondary | ICD-10-CM | POA: Insufficient documentation

## 2013-05-11 DIAGNOSIS — E1129 Type 2 diabetes mellitus with other diabetic kidney complication: Secondary | ICD-10-CM | POA: Insufficient documentation

## 2013-05-11 DIAGNOSIS — R5381 Other malaise: Secondary | ICD-10-CM | POA: Insufficient documentation

## 2013-05-11 DIAGNOSIS — I120 Hypertensive chronic kidney disease with stage 5 chronic kidney disease or end stage renal disease: Secondary | ICD-10-CM | POA: Insufficient documentation

## 2013-05-11 DIAGNOSIS — G709 Myoneural disorder, unspecified: Secondary | ICD-10-CM | POA: Insufficient documentation

## 2013-05-11 LAB — CBC WITH DIFFERENTIAL/PLATELET
Basophils Relative: 1 % (ref 0–1)
Eosinophils Relative: 4 % (ref 0–5)
HCT: 32.4 % — ABNORMAL LOW (ref 39.0–52.0)
Hemoglobin: 10.3 g/dL — ABNORMAL LOW (ref 13.0–17.0)
MCH: 31.8 pg (ref 26.0–34.0)
MCHC: 31.8 g/dL (ref 30.0–36.0)
MCV: 100 fL (ref 78.0–100.0)
Monocytes Absolute: 0.7 10*3/uL (ref 0.1–1.0)
Monocytes Relative: 11 % (ref 3–12)
Neutro Abs: 3.6 10*3/uL (ref 1.7–7.7)

## 2013-05-11 LAB — COMPREHENSIVE METABOLIC PANEL
Albumin: 2.2 g/dL — ABNORMAL LOW (ref 3.5–5.2)
BUN: 34 mg/dL — ABNORMAL HIGH (ref 6–23)
Calcium: 8.8 mg/dL (ref 8.4–10.5)
Chloride: 89 mEq/L — ABNORMAL LOW (ref 96–112)
Creatinine, Ser: 9.7 mg/dL — ABNORMAL HIGH (ref 0.50–1.35)
GFR calc non Af Amer: 5 mL/min — ABNORMAL LOW (ref >90)
Total Bilirubin: 1 mg/dL (ref 0.3–1.2)

## 2013-05-11 LAB — POCT I-STAT TROPONIN I: Troponin i, poc: 0.03 ng/mL (ref 0.00–0.08)

## 2013-05-11 MED ORDER — SODIUM CHLORIDE 0.9 % IV BOLUS (SEPSIS): 500.0000 mL | Freq: Once | INTRAVENOUS | Status: DC

## 2013-05-11 MED ORDER — SODIUM CHLORIDE 0.9 % IV BOLUS (SEPSIS)
250.0000 mL | Freq: Once | INTRAVENOUS | Status: AC
  Administered 2013-05-11: 250 mL via INTRAVENOUS

## 2013-05-11 NOTE — ED Provider Notes (Signed)
67 year old male on peritoneal dialysis, presents with generalized weakness. He states that he feels like his hemoglobin is dropping, he has done this in the past and has made him have generalized fatigue. On exam he has a soft abdomen with no tenderness whatsoever, clear heart and lung sounds and no acute findings. Laboratory workup shows no acute findings, IV fluids given gently, patient improved significantly, requested discharge after ambulating emergency department without difficulty. There is no focal weakness, no signs of myasthenia gravis, Guillain-Barr, patient stable for discharge to followup in outpatient setting.  Medical screening examination/treatment/procedure(s) were conducted as a shared visit with non-physician practitioner(s) and myself.  I personally evaluated the patient during the encounter    Vida Roller, MD 05/11/13 2011

## 2013-05-11 NOTE — ED Provider Notes (Signed)
History    CSN: [id number] Arrival date & time 05/11/13  1242  First MD Initiated Contact with Patient 05/11/13 1249     Chief Complaint  Patient presents with  . Dizziness   (Consider location/radiation/quality/duration/timing/severity/associated sxs/prior Treatment) HPI Comments: 67 year old male past medical history of end-stage renal disease on peritoneal dialysis, diabetes, iron deficiency anemia, hepatitis and HIV presents to the emergency department with his partner via EMS complaining of increasing weakness, fatigue, shortness of breath and dizziness over the past few days. Patient states he has been having issues with weakness and fatigue over the past month, was seen in the emergency department on July 1, had a complete workup including a cardiac workup, was treated with IV fluids and discharged home. Since then he saw his nephrologist in [place] who changed around his peritoneal dialysis medications, however his symptoms have not improved. He does not make urine. Dialyzes 7 days a week. Symptoms are present constantly, however worse on exertion and it is interfering with his daily life. He had an episode of diarrhea today that, since then has had normal bowel movements.  Denies bloody stool. Denies chest pain, nausea, vomiting, fever or chills. Over the past year he has had both a colonoscopy and endoscopy, EGD and was found that he had "bleeding spots", had a small bowel follow-through which showed "20 bleeding spots". States he is concerned about his hemoglobin, because whenever his hemoglobin goes below 9 he becomes symptomatic. Last CD4 count 410 on 01/31/2013.  The history is provided by the patient and a significant other.   Past Medical History  Diagnosis Date  . ESRD on peritoneal dialysis   . HIV disease   . Hypertension   . Shortness of breath   . DM type 2 causing renal disease   . Neuromuscular disorder     tremers  . Cancer     hx of rectal cancer  . Anemia  09/02/2012  . Hepatitis     hx of hepatitis B  . Iron (Fe) deficiency anemia 09/02/2012   Past Surgical History  Procedure Laterality Date  . Hemorroidectomy    . Insertion of dialysis catheter    . Hernia repair      TESTICLE   History reviewed. No pertinent family history. History  Substance Use Topics  . Smoking status: Current Every Day Smoker -- 0.25 packs/day for 50 years    Types: Cigarettes  . Smokeless tobacco: Never Used  . Alcohol Use: No    Review of Systems  Constitutional: Positive for activity change, appetite change and fatigue. Negative for fever and chills.  Eyes: Negative for visual disturbance.  Respiratory: Positive for shortness of breath. Negative for cough.   Cardiovascular: Negative for chest pain.  Gastrointestinal: Positive for diarrhea. Negative for nausea, vomiting, abdominal pain, constipation and blood in stool.  Neurological: Positive for dizziness, weakness and light-headedness. Negative for syncope and speech difficulty.  Psychiatric/Behavioral: Negative for confusion.  All other systems reviewed and are negative.    Allergies  Codeine  Home Medications   Current Outpatient Rx  Name  Route  Sig  Dispense  Refill  . allopurinol (ZYLOPRIM) 100 MG tablet   Oral   Take 100 mg by mouth daily.         . calcium acetate (PHOSLO) 667 MG capsule   Oral   Take 2,668 mg by mouth 3 (three) times daily with meals. Takes 4 capsules         . docusate sodium (COLACE)  100 MG capsule   Oral   Take 100 mg by mouth 3 (three) times daily.          . ferrous sulfate 325 (65 FE) MG tablet   Oral   Take 325 mg by mouth 3 (three) times daily with meals.          . fosamprenavir (LEXIVA) 700 MG tablet   Oral   Take 700 mg by mouth 2 (two) times daily with a meal.         . furosemide (LASIX) 80 MG tablet   Oral   Take 80 mg by mouth 2 (two) times daily.         Marland Kitchen gabapentin (NEURONTIN) 100 MG capsule   Oral   Take 100 mg by mouth  daily.         . insulin glargine (LANTUS) 100 UNIT/ML injection   Subcutaneous   Inject 28 Units into the skin every morning.         . insulin regular (NOVOLIN R,HUMULIN R) 100 units/mL injection   Subcutaneous   Inject 14-22 Units into the skin See admin instructions. Pt takes anywhere from 14-22 units 3 times daily as directed per sliding scale. Pt also takes 18 units at night prior to home dialysis.         Marland Kitchen latanoprost (XALATAN) 0.005 % ophthalmic solution   Both Eyes   Place 1 drop into both eyes at bedtime.         Marland Kitchen loratadine (CLARITIN) 10 MG tablet   Oral   Take 10 mg by mouth daily.         . metoCLOPramide (REGLAN) 5 MG tablet   Oral   Take 5 mg by mouth 3 (three) times daily.         . multivitamin (RENA-VIT) TABS tablet   Oral   Take 1 tablet by mouth daily.         . pantoprazole (PROTONIX) 40 MG tablet   Oral   Take 1 tablet (40 mg total) by mouth daily.   30 tablet   0   . polyvinyl alcohol (LIQUIFILM TEARS) 1.4 % ophthalmic solution   Both Eyes   Place 1 drop into both eyes 4 (four) times daily.         . pravastatin (PRAVACHOL) 20 MG tablet   Oral   Take 1 tablet (20 mg total) by mouth daily at 6 PM.   30 tablet   0   . raltegravir (ISENTRESS) 400 MG tablet   Oral   Take 400 mg by mouth 2 (two) times daily.         . ritonavir (NORVIR) 100 MG capsule   Oral   Take 100 mg by mouth 2 (two) times daily.         . sildenafil (VIAGRA) 50 MG tablet   Oral   Take 50 mg by mouth daily as needed. For erectile dysfunction         . tenofovir (VIREAD) 300 MG tablet   Oral   Take 300 mg by mouth every 7 (seven) days. Take on Tuesdays after hemodialysis.          BP 100/62  Pulse 99  Temp(Src) 97.5 F (36.4 C) (Oral)  Resp 20  SpO2 99% Physical Exam  Nursing note and vitals reviewed. Constitutional: He is oriented to person, place, and time. He appears well-developed and well-nourished. No distress.  Appears fatigued.   HENT:  Head: Normocephalic and  atraumatic.  Mouth/Throat: Uvula is midline. Mucous membranes are dry.  Eyes: Conjunctivae and EOM are normal. Pupils are equal, round, and reactive to light. No scleral icterus.  Neck: Normal range of motion. No JVD present.  Cardiovascular: Normal rate, regular rhythm, intact distal pulses and normal pulses.   Pulmonary/Chest: Effort normal and breath sounds normal. He has no decreased breath sounds. He has no wheezes. He has no rhonchi. He has no rales.  Abdominal: Soft. Bowel sounds are normal. He exhibits no distension and no mass. There is no tenderness. There is no rebound and no guarding.  Genitourinary: Rectal exam shows external hemorrhoid (no bleeding or thrombosis) and tenderness. Rectal exam shows no internal hemorrhoid, no fissure, no mass and anal tone normal. Guaiac positive stool.  No gross blood on exam glove. Normal color stool.  Neurological: He is alert and oriented to person, place, and time. He has normal strength. No sensory deficit. Coordination normal.  Skin: Skin is warm and dry. He is not diaphoretic.  Psychiatric: He has a normal mood and affect. His behavior is normal.    ED Course  Procedures (including critical care time) Labs Reviewed  CBC WITH DIFFERENTIAL - Abnormal; Notable for the following:    RBC 3.24 (*)    Hemoglobin 10.3 (*)    HCT 32.4 (*)    RDW 19.3 (*)    All other components within normal limits  COMPREHENSIVE METABOLIC PANEL - Abnormal; Notable for the following:    Sodium 130 (*)    Chloride 89 (*)    Glucose, Bld 245 (*)    BUN 34 (*)    Creatinine, Ser 9.70 (*)    Albumin 2.2 (*)    Alkaline Phosphatase 255 (*)    GFR calc non Af Amer 5 (*)    GFR calc Af Amer 6 (*)    All other components within normal limits  OCCULT BLOOD, POC DEVICE - Abnormal; Notable for the following:    Fecal Occult Bld POSITIVE (*)    All other components within normal limits  CULTURE, BLOOD (ROUTINE X 2)  CULTURE, BLOOD  (ROUTINE X 2)  POCT I-STAT TROPONIN I    Date: 05/11/2013  Rate: 93  Rhythm: normal sinus rhythm  QRS Axis: left  Intervals: QT prolonged  ST/T Wave abnormalities: normal  Conduction Disutrbances:right bundle branch block and left anterior fascicular block  Narrative Interpretation: no sig changes since EKG obtained on 04/28/2013  Old EKG Reviewed: unchanged   Dg Chest 2 View  05/11/2013   *RADIOLOGY REPORT*  Clinical Data: Dizziness, hypertension, HIV, diabetes, end-stage renal disease, smoker  CHEST - 2 VIEW  Comparison: 09/02/2012  Findings: Normal heart size, mediastinal contours, and pulmonary vascularity. Lungs clear. No pleural effusion or pneumothorax. Atherosclerotic calcification aortic arch. No acute osseous findings.  IMPRESSION: No acute abnormalities.   Original Report Authenticated By: Ulyses Southward, M.D.   1. Weakness   2. Fatigue   3. Hypotension     MDM  Patient with worsening fatigue, weakness, shortness of breath and dizziness. He appears dry. Physical exam otherwise unremarkable. History of end-stage renal disease, HIV and iron deficiency anemia which could all be contributing to his symptoms. Occult blood obtained due to history of "bleeding spots" on endoscopy with small bowel follow-through which was positive for occult blood. No gross blood on rectal exam. Patient is afebrile, borderline tachycardic, no respiratory distress. Blood pressure 100/62. Will give 250 cc bolus along with PO fluids, CBC, CMP, troponin, EKG and chest  x-ray. Case discussed with Dr. Hyacinth Meeker who agrees with plan of care. 3:17 PM Workup without any finding explaining patient's symptoms. After receiving fluids, he is feeling better. I personally walked patient around the ED and he stated "I feel so much better" and he was able to ambulate without difficulty. He will be discharged home. Advised to stay well hydrated. Return precautions discussed. Patient states understanding of plan and is  agreeable. Patient also evaluated by Dr. Hyacinth Meeker who agrees with plan of care.  Trevor Mace, PA-C 05/11/13 [phone]

## 2013-05-11 NOTE — ED Notes (Signed)
Patient from home states he was having dizziness for the past couple of days and today he felt as if he was going to pass out

## 2013-08-10 ENCOUNTER — Other Ambulatory Visit: Payer: Self-pay | Admitting: Vascular Surgery

## 2013-08-10 DIAGNOSIS — Z01810 Encounter for preprocedural cardiovascular examination: Secondary | ICD-10-CM

## 2013-08-10 DIAGNOSIS — N186 End stage renal disease: Secondary | ICD-10-CM

## 2013-08-11 ENCOUNTER — Other Ambulatory Visit: Payer: Self-pay | Admitting: *Deleted

## 2013-08-11 DIAGNOSIS — N186 End stage renal disease: Secondary | ICD-10-CM

## 2013-08-11 DIAGNOSIS — Z01810 Encounter for preprocedural cardiovascular examination: Secondary | ICD-10-CM

## 2013-08-12 ENCOUNTER — Other Ambulatory Visit: Payer: Self-pay | Admitting: Family Medicine

## 2013-08-12 DIAGNOSIS — I739 Peripheral vascular disease, unspecified: Secondary | ICD-10-CM

## 2013-08-14 ENCOUNTER — Ambulatory Visit
Admission: RE | Admit: 2013-08-14 | Discharge: 2013-08-14 | Disposition: A | Discharge status: Home / Self Care | Payer: Medicare PPO | Source: Ambulatory Visit | Attending: Family Medicine | Admitting: Family Medicine

## 2013-08-14 DIAGNOSIS — I739 Peripheral vascular disease, unspecified: Secondary | ICD-10-CM

## 2013-08-17 ENCOUNTER — Ambulatory Visit
Admission: RE | Admit: 2013-08-17 | Discharge: 2013-08-17 | Disposition: A | Discharge status: Home / Self Care | Payer: Medicare PPO | Source: Ambulatory Visit | Attending: Family Medicine | Admitting: Family Medicine

## 2013-08-17 DIAGNOSIS — I739 Peripheral vascular disease, unspecified: Secondary | ICD-10-CM

## 2013-08-20 ENCOUNTER — Other Ambulatory Visit (HOSPITAL_COMMUNITY): Payer: Medicare PPO

## 2013-08-20 ENCOUNTER — Ambulatory Visit: Payer: Medicare PPO | Admitting: Vascular Surgery

## 2013-08-20 ENCOUNTER — Inpatient Hospital Stay (HOSPITAL_COMMUNITY): Admission: RE | Admit: 2013-08-20 | Payer: Medicare PPO | Source: Ambulatory Visit

## 2013-09-02 ENCOUNTER — Other Ambulatory Visit: Payer: Self-pay | Admitting: *Deleted

## 2013-09-02 DIAGNOSIS — I739 Peripheral vascular disease, unspecified: Secondary | ICD-10-CM

## 2013-10-08 ENCOUNTER — Encounter: Payer: Medicare PPO | Admitting: Vascular Surgery

## 2013-10-08 ENCOUNTER — Other Ambulatory Visit (HOSPITAL_COMMUNITY): Payer: Medicare PPO

## 2013-10-21 ENCOUNTER — Encounter: Payer: Medicare PPO | Admitting: Vascular Surgery

## 2013-10-21 ENCOUNTER — Other Ambulatory Visit (HOSPITAL_COMMUNITY): Payer: Medicare PPO

## 2013-11-25 IMAGING — CR DG CHEST 2V
2 series · 2 of 2 positions shown · non-contrast
Comparison: 09/02/2012

CLINICAL DATA: Dizziness, hypertension, HIV, diabetes, end-stage
renal disease, smoker

CHEST - 2 VIEW

[w chest lat]
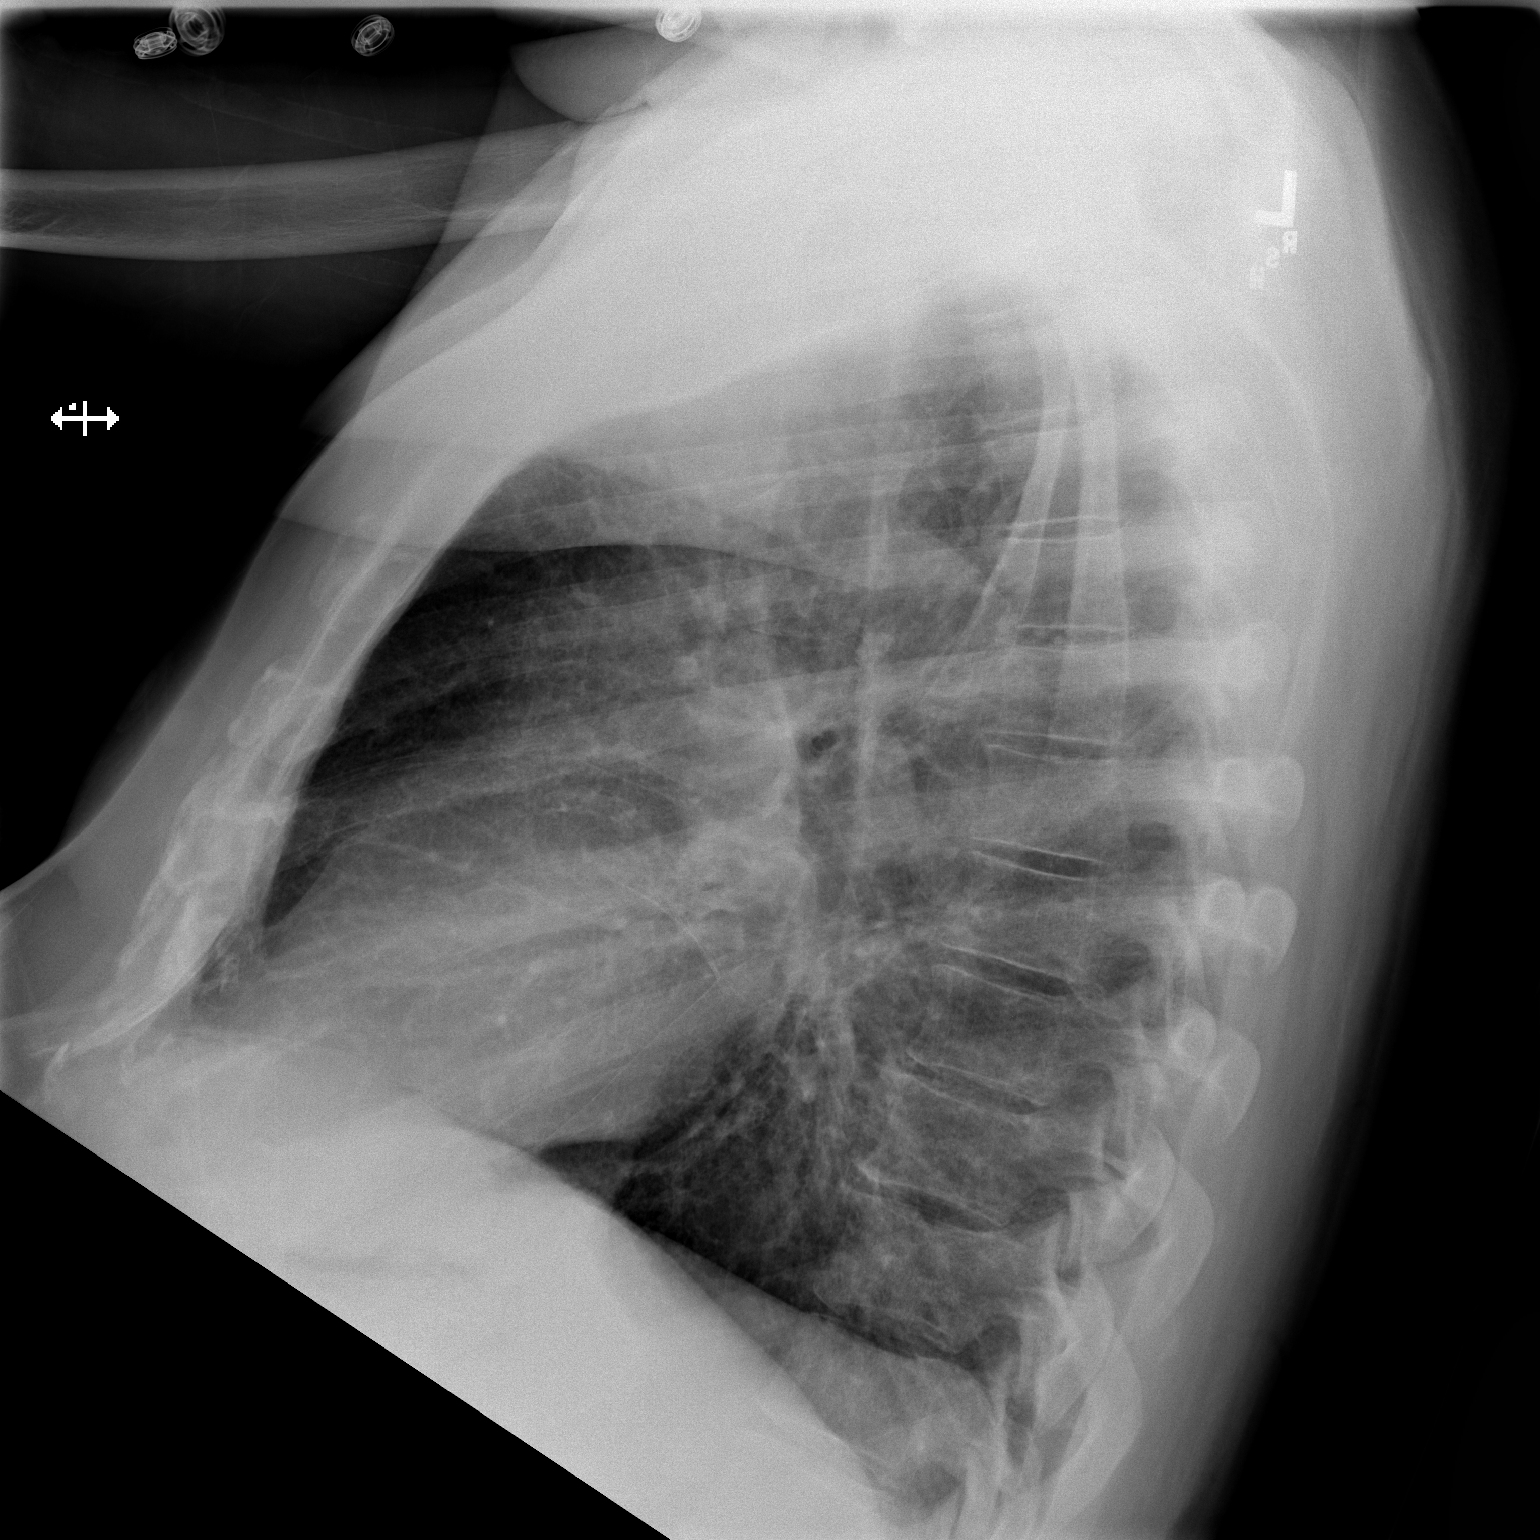

[x chest ap]
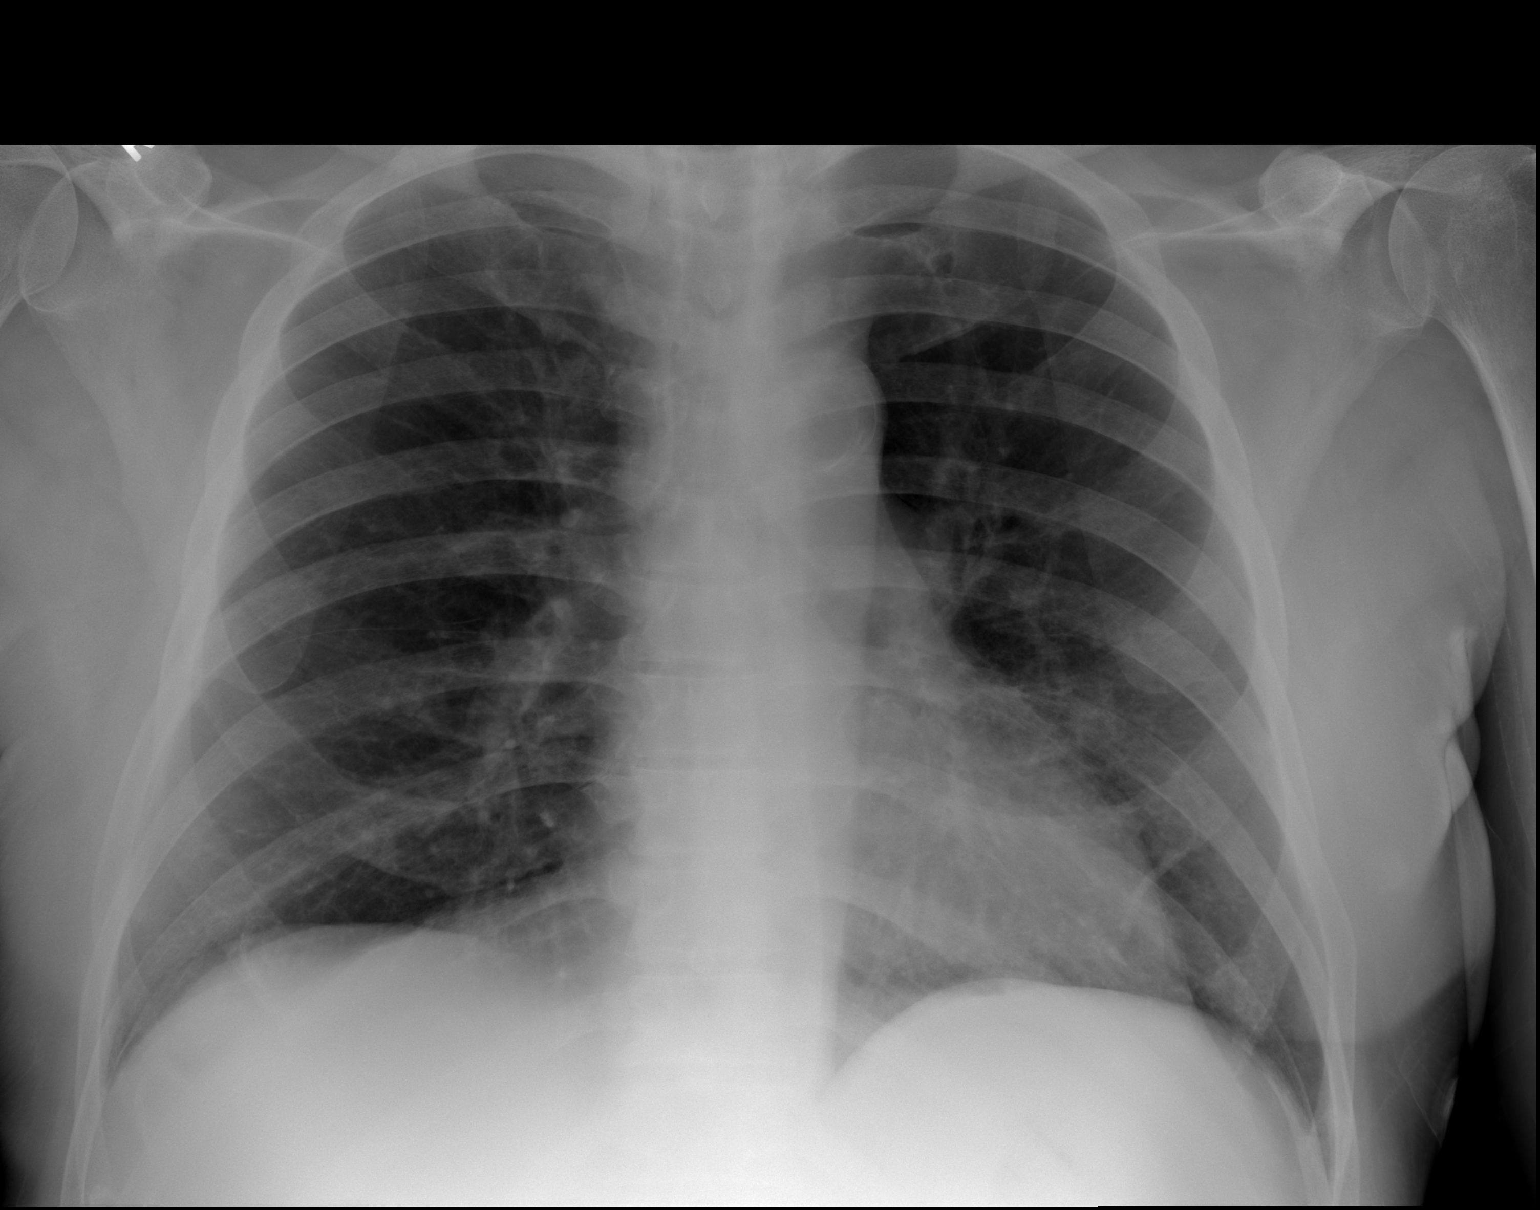

[2 of 2 positions shown; findings below may reference images not displayed]

FINDINGS: Normal heart size, mediastinal contours, and pulmonary vascularity.
Lungs clear.
No pleural effusion or pneumothorax.
Atherosclerotic calcification aortic arch.
No acute osseous findings.
IMPRESSION: No acute abnormalities.

## 2013-12-31 ENCOUNTER — Other Ambulatory Visit: Payer: Self-pay | Admitting: *Deleted

## 2013-12-31 DIAGNOSIS — I739 Peripheral vascular disease, unspecified: Secondary | ICD-10-CM

## 2014-01-01 ENCOUNTER — Encounter: Payer: Medicare PPO | Admitting: Vascular Surgery

## 2014-01-01 ENCOUNTER — Encounter (HOSPITAL_COMMUNITY): Payer: Medicare PPO

## 2014-01-07 ENCOUNTER — Encounter: Payer: Self-pay | Admitting: Vascular Surgery

## 2014-01-08 ENCOUNTER — Ambulatory Visit (INDEPENDENT_AMBULATORY_CARE_PROVIDER_SITE_OTHER): Payer: Medicare PPO | Admitting: Vascular Surgery

## 2014-01-08 ENCOUNTER — Encounter: Payer: Self-pay | Admitting: Vascular Surgery

## 2014-01-08 ENCOUNTER — Ambulatory Visit (HOSPITAL_COMMUNITY)
Admission: RE | Admit: 2014-01-08 | Discharge: 2014-01-08 | Disposition: A | Discharge status: Home / Self Care | Payer: Medicare PPO | Source: Ambulatory Visit | Attending: Vascular Surgery | Admitting: Vascular Surgery

## 2014-01-08 VITALS — BP 105/41 | HR 78 | Resp 16 | Ht 71.0 in | Wt 192.5 lb

## 2014-01-08 DIAGNOSIS — I739 Peripheral vascular disease, unspecified: Secondary | ICD-10-CM | POA: Insufficient documentation

## 2014-01-08 DIAGNOSIS — I70269 Atherosclerosis of native arteries of extremities with gangrene, unspecified extremity: Secondary | ICD-10-CM

## 2014-01-08 NOTE — Progress Notes (Addendum)
Referred by:  Iona Beard, MD [address] [place], [place] [address]  Reason for referral: second opinion on need for L BKA  History of Present Illness  Benjamin Long is a 68 y.o. (03-21-1946) male who presents with chief complaint: L toe gangrene.  [facility]set [facility] symptom [facility]   Pain is described as intermittently aching, severity 1-5/10, and associated with manipulating Left great toe.  Patient has attempted to treat this pain with pain rx.  The patient has intermittent rest pain symptoms also and chronic left great toe gangrene.  Pt is primarily managed by a [facility].  Angiography and reported stenting has been completed in the past.  Pt reported has been told he needs a L BKA.  Atherosclerotic risk factors include: HTN, possible insulin resistance, and former smoker.  Past Medical History  Diagnosis Date  . ESRD on peritoneal dialysis   . HIV disease   . Hypertension   . Shortness of breath   . DM type 2 causing renal disease   . Neuromuscular disorder     tremers  . Cancer     hx of rectal cancer  . Anemia 09/02/2012  . Hepatitis     hx of hepatitis B  . Iron (Fe) deficiency anemia 09/02/2012  . Peripheral vascular disease     Past Surgical History  Procedure Laterality Date  . Hemorroidectomy    . Insertion of dialysis catheter    . Hernia repair      TESTICLE    History   Social History  . Marital Status: Married    Spouse Name: N/A    Number of Children: N/A  . Years of Education: N/A   Occupational History  . Not on file.   Social History Main Topics  . Smoking status: Former Smoker -- 0.25 packs/day for 50 years    Types: Cigarettes    Quit date: 01/08/2014  . Smokeless tobacco: Never Used  . Alcohol Use: No  . Drug Use: No  . Sexual Activity: Not on file   Other Topics Concern  . Not on file   Social History Narrative  . No narrative on file    Family History  Problem Relation  Age of Onset  . Diabetes Mother   . Cancer Mother     gallbladder  . Heart disease Father   . Cancer Sister     gallbladder    Current Outpatient Prescriptions on File Prior to Visit  Medication Sig Dispense Refill  . allopurinol (ZYLOPRIM) 100 MG tablet Take 100 mg by mouth daily.      . calcium acetate (PHOSLO) 667 MG capsule Take 2,668 mg by mouth 3 (three) times daily with meals. Takes 4 capsules      . docusate sodium (COLACE) 100 MG capsule Take 100 mg by mouth 3 (three) times daily.       . ferrous sulfate 325 (65 FE) MG tablet Take 325 mg by mouth 3 (three) times daily with meals.       . fosamprenavir (LEXIVA) 700 MG tablet Take 700 mg by mouth 2 (two) times daily with a meal.      . gabapentin (NEURONTIN) 100 MG capsule Take 100 mg by mouth daily.      . insulin glargine (LANTUS) 100 UNIT/ML injection Inject 28 Units into the skin every morning.      . insulin regular (NOVOLIN R,HUMULIN R) 100 units/mL injection Inject  14-22 Units into the skin See admin instructions. Pt takes anywhere from 14-22 units 3 times daily as directed per sliding scale. Pt also takes 18 units at night prior to home dialysis.      Marland Kitchen latanoprost (XALATAN) 0.005 % ophthalmic solution Place 1 drop into both eyes at bedtime.      Marland Kitchen loratadine (CLARITIN) 10 MG tablet Take 10 mg by mouth daily.      . metoCLOPramide (REGLAN) 5 MG tablet Take 5 mg by mouth 3 (three) times daily.      . multivitamin (RENA-VIT) TABS tablet Take 1 tablet by mouth daily.      . pantoprazole (PROTONIX) 40 MG tablet Take 40 mg by mouth 2 (two) times daily.      . polyvinyl alcohol (LIQUIFILM TEARS) 1.4 % ophthalmic solution Place 1 drop into both eyes 4 (four) times daily.      . pravastatin (PRAVACHOL) 20 MG tablet Take 1 tablet (20 mg total) by mouth daily at 6 PM.  30 tablet  0  . raltegravir (ISENTRESS) 400 MG tablet Take 400 mg by mouth 2 (two) times daily.      . ritonavir (NORVIR) 100 MG capsule Take 100 mg by mouth 2 (two)  times daily.      . sildenafil (VIAGRA) 50 MG tablet Take 50 mg by mouth daily as needed. For erectile dysfunction      . tenofovir (VIREAD) 300 MG tablet Take 300 mg by mouth every 7 (seven) days. Take on Tuesdays after hemodialysis.       No current facility-administered medications on file prior to visit.    Allergies  Allergen Reactions  . Codeine Nausea And Vomiting and Nausea Only    REVIEW OF SYSTEMS:  (Positives checked otherwise negative)  CARDIOVASCULAR:  []  chest pain, []  chest pressure, []  palpitations, []  shortness of breath when laying flat, []  shortness of breath with exertion,  [x]  pain in feet when walking, [x]  pain in feet when laying flat, []  history of blood clot in veins (DVT), []  history of phlebitis, [x]  swelling in legs, []  varicose veins  PULMONARY:  []  productive cough, []  asthma, []  wheezing  NEUROLOGIC:  [x]  weakness in arms or legs, [x]  numbness in arms or legs, []  difficulty speaking or slurred speech, []  temporary loss of vision in one eye, []  dizziness  HEMATOLOGIC:  [x]  bleeding problems, []  problems with blood clotting too easily  MUSCULOSKEL:  []  joint pain, []  joint swelling  GASTROINTEST:  []  vomiting blood, []  blood in stool     GENITOURINARY:  [x]  burning with urination, []  blood in urine  PSYCHIATRIC:  []  history of major depression  INTEGUMENTARY:  []  rashes, []  ulcers  CONSTITUTIONAL:  [x]  fever, []  chills  For VQI Use Only   PRE-ADM LIVING: Home  AMB STATUS: Ambulatory  CAD Sx: None  PRIOR CHF: None  STRESS TEST: [x]  No, [ ]  Normal, [ ]  + ischemia, [ ]  + MI, [ ]  Both  Physical Examination  Filed Vitals:   01/08/14 1611  BP: 105/41  Pulse: 78  Resp: 16  Height: 5\' 11"  (1.803 m)  Weight: 192 lb 8 oz (87.317 kg)   Body mass index is 26.86 kg/(m^2).  General: A&O x 3, WD, thin  Head: [place]/AT  Ear/Nose/Throat: Hearing grossly intact, nares w/o erythema or drainage, oropharynx w/o Erythema/Exudate, Mallampati score:  3  Eyes: PERRLA, EOMI  Neck: Supple, no nuchal rigidity, no palpable LAD  Pulmonary: Sym exp, good air movt, CTAB,  no rales, rhonchi, & wheezing  Cardiac: RRR, Nl S1, S2, no Murmurs, rubs or gallops  Vascular: Vessel Right Left  Radial Palpable Palpable  Brachial Palpable Palpable  Carotid Palpable, without bruit Palpable, without bruit  Aorta Not palpable N/A  Femoral Palpable Palpable  Popliteal Not palpable Not palpable  PT Faintly Palpable Not Palpable  DP Faintly Palpable Not Palpable   Gastrointestinal: soft, NTND, -G/R, - HSM, - masses, - CVAT B  Musculoskeletal: M/S 5/5 throughout  Extremities without ischemic changes except  Cool left foot, right distal 1st phalange with dry gangrene  Neurologic: CN 2-12 intact , somewhat slurred speech, Pain and light touch intact in extremities , Motor exam as listed above  Psychiatric: Judgment intact, Mood & affect appropriate for pt's clinical situation  Dermatologic: See M/S exam for extremity exam, no rashes otherwise noted  Lymph : No Cervical, Axillary, or Inguinal lymphadenopathy   Non-Invasive Vascular Imaging  ABI (Date: 01/08/2014)  R: [place], DP: bi, PT: bi, TBI: 0.39  L: 0.91, DP: mono, PT: mono, TBI: 0  Outside Studies/Documentation 10 pages of outside documents were reviewed including: outside PCP chart, outside non-invasive studies.  Medical Decision Making  Laurette SchimkeStephen Vitug is a 68 y.o. male who presents with: LLE critical limb ischemia.   I discussed with the patient the natural history of critical limb ischemia: 25% require amputation in one year, 50% are able to maintain their limbs in one year, and 25-30% die in one year due to comorbidities.  Given the limb threatening status of this patient, I recommend an aggressive work up including proceeding with an: Aortogram, Bilateral runoff and intervention.  In diabetic, non-invasive studies tend to be of limited value due to medial calcification.  By report,  this patient has already had two outside angiograms done in the last 6 months, so he was not interested in proceeding.    [facility].  The patient will follow up in two weeks.  Hopefully, we have the reports by then.  Depending on those angiographic findings, I should be able to make a decision in regards to the a L BKA.  Minimally, this patient will need a L great toe amputation.  Based on the ABIs, I  suspect he probably needs a L AKA.  I discussed in depth with the patient the nature of atherosclerosis, and emphasized the importance of maximal medical management including strict control of blood pressure, blood glucose, and lipid levels, antiplatelet agents, obtaining regular exercise, and cessation of smoking.  The patient is aware that without maximal medical management the underlying atherosclerotic disease process will progress, limiting the benefit of any interventions. The patient is currently on a statin:  Pravachol. The patient is currently on an anti-platelet: ASA.  Thank you for allowing us to participate in this patient's care.  Leonides SakeBrian Analiza Cowger, MD [facility] Office: [phone] Pager: [phone]  01/08/2014, 6:33 PM

## 2014-01-21 ENCOUNTER — Encounter: Payer: Self-pay | Admitting: Vascular Surgery

## 2014-01-22 ENCOUNTER — Ambulatory Visit (INDEPENDENT_AMBULATORY_CARE_PROVIDER_SITE_OTHER): Payer: Medicare PPO | Admitting: Vascular Surgery

## 2014-01-22 ENCOUNTER — Encounter: Payer: Self-pay | Admitting: Vascular Surgery

## 2014-01-22 VITALS — BP 114/64 | HR 71 | Ht 71.0 in | Wt 194.1 lb

## 2014-01-22 DIAGNOSIS — I70269 Atherosclerosis of native arteries of extremities with gangrene, unspecified extremity: Secondary | ICD-10-CM

## 2014-01-22 NOTE — Progress Notes (Signed)
    Established Critical Limb Ischemia Patient  History of Present Illness  Benjamin Long is a 68 y.o. ([birth year]) male who presents with chief complaint: Left great toe gangrene.  The patient returns to discuss his revascularization options.  Since his last visit, the [facility] has faxed the prior angiogram reports.  Additionally since then the [place] has decided to repeat the L leg angiogram and offer the patient a bypass.  The patient has no rest pain and wounds include: left great toe gangrene.  The patient notes symptoms have not progressed.  The patient's treatment regimen currently included: maximal medical management.  The patient's PMH, PSH, SH, FamHx, Med, and Allergies are unchanged from 01/08/14.  On ROS today: no fever or chills, no drainage from left great toe  Physical Examination  Filed Vitals:   01/22/14 1102  BP: 114/64  Pulse: 71  Height: 5\' 11"  (1.803 m)  Weight: 194 lb 1.6 oz (88.043 kg)  SpO2: 99%   Body mass index is 27.08 kg/(m^2).  General: A&O x 3, WDWN  Eyes: PERRLA, EOMI  Pulmonary: Sym exp, good air movt, CTAB, no rales, rhonchi, & wheezing  Cardiac: RRR, Nl S1, S2, no Murmurs, rubs or gallops  Vascular:  Vessel  Right  Left   Radial  Palpable  Palpable   Brachial  Palpable  Palpable   Carotid  Palpable, without bruit  Palpable, without bruit   Aorta  Not palpable  N/A   Femoral  Palpable  Palpable   Popliteal  Not palpable  Not palpable   PT  Faintly Palpable  Not Palpable   DP  Faintly Palpable  Not Palpable    Gastrointestinal: soft, NTND, -G/R, - HSM, - masses, - CVAT B  Musculoskeletal: M/S 5/5 throughout , Extremities without ischemic changes  except L great toe dry gangrene, no cellulitis findings  Neurologic: Pain and light touch intact in extremities , Motor exam as listed above  Outside Studies/Documentation 8 pages of outside documents were reviewed including: [place] angiographic reports.  Medical Decision Making  Benjamin Long is a 68 y.o. male who presents with: LLE critical limb ischemia with Left great toe dry gangrene   Appears the patient has under go change of attending care and the new attending at [facility] is offering him the same procedures I would offer: L leg runoff to help guide a L femoral-peroneal bypass and left great toe amputation.  I will defer the patient back to the [place] at this point.  Thank you for allowing [place] to participate in this patient's care.  Adele Barthel, MD [facility] [place] Office: [phone] Pager: [phone]  01/22/2014, 11:58 AM

## 2014-01-27 HISTORY — PX: OTHER SURGICAL HISTORY: SHX169

## 2014-08-26 ENCOUNTER — Inpatient Hospital Stay (HOSPITAL_COMMUNITY)
Admission: EM | Admit: 2014-08-26 | Discharge: 2014-08-28 | DRG: 977 | Disposition: A | Discharge status: Home / Self Care | Payer: Non-veteran care | Attending: Internal Medicine | Admitting: Internal Medicine

## 2014-08-26 ENCOUNTER — Emergency Department (HOSPITAL_COMMUNITY): Payer: Non-veteran care

## 2014-08-26 ENCOUNTER — Encounter (HOSPITAL_COMMUNITY): Payer: Self-pay | Admitting: Emergency Medicine

## 2014-08-26 DIAGNOSIS — Z87891 Personal history of nicotine dependence: Secondary | ICD-10-CM | POA: Diagnosis not present

## 2014-08-26 DIAGNOSIS — K7460 Unspecified cirrhosis of liver: Secondary | ICD-10-CM | POA: Diagnosis present

## 2014-08-26 DIAGNOSIS — D696 Thrombocytopenia, unspecified: Secondary | ICD-10-CM | POA: Diagnosis present

## 2014-08-26 DIAGNOSIS — E1121 Type 2 diabetes mellitus with diabetic nephropathy: Secondary | ICD-10-CM

## 2014-08-26 DIAGNOSIS — N186 End stage renal disease: Secondary | ICD-10-CM | POA: Diagnosis present

## 2014-08-26 DIAGNOSIS — Z9221 Personal history of antineoplastic chemotherapy: Secondary | ICD-10-CM

## 2014-08-26 DIAGNOSIS — C801 Malignant (primary) neoplasm, unspecified: Secondary | ICD-10-CM

## 2014-08-26 DIAGNOSIS — I120 Hypertensive chronic kidney disease with stage 5 chronic kidney disease or end stage renal disease: Secondary | ICD-10-CM | POA: Diagnosis present

## 2014-08-26 DIAGNOSIS — K922 Gastrointestinal hemorrhage, unspecified: Secondary | ICD-10-CM

## 2014-08-26 DIAGNOSIS — I739 Peripheral vascular disease, unspecified: Secondary | ICD-10-CM | POA: Diagnosis present

## 2014-08-26 DIAGNOSIS — Z992 Dependence on renal dialysis: Secondary | ICD-10-CM | POA: Diagnosis not present

## 2014-08-26 DIAGNOSIS — E1122 Type 2 diabetes mellitus with diabetic chronic kidney disease: Secondary | ICD-10-CM | POA: Diagnosis present

## 2014-08-26 DIAGNOSIS — M868X7 Other osteomyelitis, ankle and foot: Secondary | ICD-10-CM | POA: Diagnosis present

## 2014-08-26 DIAGNOSIS — Z7982 Long term (current) use of aspirin: Secondary | ICD-10-CM

## 2014-08-26 DIAGNOSIS — D631 Anemia in chronic kidney disease: Secondary | ICD-10-CM | POA: Diagnosis present

## 2014-08-26 DIAGNOSIS — Z79899 Other long term (current) drug therapy: Secondary | ICD-10-CM

## 2014-08-26 DIAGNOSIS — K759 Inflammatory liver disease, unspecified: Secondary | ICD-10-CM

## 2014-08-26 DIAGNOSIS — N2581 Secondary hyperparathyroidism of renal origin: Secondary | ICD-10-CM | POA: Diagnosis present

## 2014-08-26 DIAGNOSIS — Z89412 Acquired absence of left great toe: Secondary | ICD-10-CM

## 2014-08-26 DIAGNOSIS — B20 Human immunodeficiency virus [HIV] disease: Secondary | ICD-10-CM | POA: Diagnosis present

## 2014-08-26 DIAGNOSIS — I70269 Atherosclerosis of native arteries of extremities with gangrene, unspecified extremity: Secondary | ICD-10-CM

## 2014-08-26 DIAGNOSIS — R531 Weakness: Secondary | ICD-10-CM | POA: Diagnosis present

## 2014-08-26 DIAGNOSIS — Z794 Long term (current) use of insulin: Secondary | ICD-10-CM

## 2014-08-26 DIAGNOSIS — D649 Anemia, unspecified: Secondary | ICD-10-CM

## 2014-08-26 DIAGNOSIS — Z85048 Personal history of other malignant neoplasm of rectum, rectosigmoid junction, and anus: Secondary | ICD-10-CM | POA: Diagnosis not present

## 2014-08-26 DIAGNOSIS — D509 Iron deficiency anemia, unspecified: Secondary | ICD-10-CM | POA: Diagnosis present

## 2014-08-26 DIAGNOSIS — E1129 Type 2 diabetes mellitus with other diabetic kidney complication: Secondary | ICD-10-CM | POA: Diagnosis present

## 2014-08-26 DIAGNOSIS — D500 Iron deficiency anemia secondary to blood loss (chronic): Secondary | ICD-10-CM | POA: Diagnosis present

## 2014-08-26 DIAGNOSIS — Z923 Personal history of irradiation: Secondary | ICD-10-CM | POA: Diagnosis not present

## 2014-08-26 DIAGNOSIS — I10 Essential (primary) hypertension: Secondary | ICD-10-CM | POA: Diagnosis present

## 2014-08-26 HISTORY — DX: Osteomyelitis, unspecified: M86.9

## 2014-08-26 LAB — COMPREHENSIVE METABOLIC PANEL
ALBUMIN: 2.7 g/dL — AB (ref 3.5–5.2)
ALT: 7 U/L (ref 0–53)
ANION GAP: 13 (ref 5–15)
AST: 15 U/L (ref 0–37)
Alkaline Phosphatase: 119 U/L — ABNORMAL HIGH (ref 39–117)
BILIRUBIN TOTAL: 0.2 mg/dL — AB (ref 0.3–1.2)
BUN: 5 mg/dL — AB (ref 6–23)
CO2: 31 mEq/L (ref 19–32)
Calcium: 8.6 mg/dL (ref 8.4–10.5)
Chloride: 96 mEq/L (ref 96–112)
Creatinine, Ser: 3.25 mg/dL — ABNORMAL HIGH (ref 0.50–1.35)
GFR calc non Af Amer: 18 mL/min — ABNORMAL LOW (ref >90)
GFR, EST AFRICAN AMERICAN: 21 mL/min — AB (ref >90)
Glucose, Bld: 217 mg/dL — ABNORMAL HIGH (ref 70–99)
Potassium: 3.5 mEq/L — ABNORMAL LOW (ref 3.7–5.3)
Sodium: 140 mEq/L (ref 137–147)
TOTAL PROTEIN: 6.9 g/dL (ref 6.0–8.3)

## 2014-08-26 LAB — CBC WITH DIFFERENTIAL/PLATELET
BASOS ABS: 0 10*3/uL (ref 0.0–0.1)
BASOS PCT: 0 % (ref 0–1)
Eosinophils Absolute: 0.1 10*3/uL (ref 0.0–0.7)
Eosinophils Relative: 2 % (ref 0–5)
HCT: 23.5 % — ABNORMAL LOW (ref 39.0–52.0)
HEMOGLOBIN: 7 g/dL — AB (ref 13.0–17.0)
Lymphocytes Relative: 22 % (ref 12–46)
Lymphs Abs: 0.9 10*3/uL (ref 0.7–4.0)
MCH: 29.5 pg (ref 26.0–34.0)
MCHC: 29.8 g/dL — AB (ref 30.0–36.0)
MCV: 99.2 fL (ref 78.0–100.0)
MONOS PCT: 13 % — AB (ref 3–12)
Monocytes Absolute: 0.6 10*3/uL (ref 0.1–1.0)
NEUTROS ABS: 2.7 10*3/uL (ref 1.7–7.7)
Neutrophils Relative %: 63 % (ref 43–77)
Platelets: 102 10*3/uL — ABNORMAL LOW (ref 150–400)
RBC: 2.37 MIL/uL — ABNORMAL LOW (ref 4.22–5.81)
RDW: 16.8 % — AB (ref 11.5–15.5)
WBC: 4.3 10*3/uL (ref 4.0–10.5)

## 2014-08-26 LAB — POC OCCULT BLOOD, ED: Fecal Occult Bld: POSITIVE — AB

## 2014-08-26 LAB — PREPARE RBC (CROSSMATCH)

## 2014-08-26 LAB — GLUCOSE, CAPILLARY: GLUCOSE-CAPILLARY: 258 mg/dL — AB (ref 70–99)

## 2014-08-26 LAB — I-STAT TROPONIN, ED: Troponin i, poc: 0.03 ng/mL (ref 0.00–0.08)

## 2014-08-26 LAB — LACTIC ACID, PLASMA: LACTIC ACID, VENOUS: 3 mmol/L — AB (ref 0.5–2.2)

## 2014-08-26 MED ORDER — PRAVASTATIN SODIUM 20 MG PO TABS
20.0000 mg | ORAL_TABLET | Freq: Every evening | ORAL | Status: DC
  Administered 2014-08-26 – 2014-08-28 (×3): 20 mg via ORAL
  Filled 2014-08-26 (×3): qty 1

## 2014-08-26 MED ORDER — SODIUM CHLORIDE 0.9 % IV SOLN: Freq: Once | INTRAVENOUS | Status: DC

## 2014-08-26 MED ORDER — NORTRIPTYLINE HCL 25 MG PO CAPS
50.0000 mg | ORAL_CAPSULE | Freq: Every day | ORAL | Status: DC
  Administered 2014-08-26 – 2014-08-27 (×2): 50 mg via ORAL
  Filled 2014-08-26 (×4): qty 2

## 2014-08-26 MED ORDER — HYDROCODONE-ACETAMINOPHEN 5-325 MG PO TABS
1.0000 | ORAL_TABLET | Freq: Four times a day (QID) | ORAL | Status: DC | PRN
  Administered 2014-08-26 – 2014-08-28 (×3): 2 via ORAL
  Filled 2014-08-26 (×3): qty 2

## 2014-08-26 MED ORDER — FOSAMPRENAVIR CALCIUM 700 MG PO TABS
700.0000 mg | ORAL_TABLET | Freq: Two times a day (BID) | ORAL | Status: DC
  Filled 2014-08-26 (×3): qty 1

## 2014-08-26 MED ORDER — ACETAMINOPHEN 325 MG PO TABS: 650.0000 mg | ORAL_TABLET | Freq: Four times a day (QID) | ORAL | Status: DC | PRN

## 2014-08-26 MED ORDER — INSULIN ASPART 100 UNIT/ML ~~LOC~~ SOLN: 0.0000 [IU] | Freq: Four times a day (QID) | SUBCUTANEOUS | Status: DC

## 2014-08-26 MED ORDER — RITONAVIR 100 MG PO CAPS
100.0000 mg | ORAL_CAPSULE | Freq: Two times a day (BID) | ORAL | Status: DC
  Administered 2014-08-27 – 2014-08-28 (×4): 100 mg via ORAL
  Filled 2014-08-26 (×5): qty 1

## 2014-08-26 MED ORDER — RALTEGRAVIR POTASSIUM 400 MG PO TABS
400.0000 mg | ORAL_TABLET | Freq: Two times a day (BID) | ORAL | Status: DC
  Administered 2014-08-26 – 2014-08-28 (×4): 400 mg via ORAL
  Filled 2014-08-26 (×5): qty 1

## 2014-08-26 MED ORDER — ALLOPURINOL 100 MG PO TABS
100.0000 mg | ORAL_TABLET | Freq: Every day | ORAL | Status: DC
  Administered 2014-08-27 – 2014-08-28 (×2): 100 mg via ORAL
  Filled 2014-08-26 (×2): qty 1

## 2014-08-26 MED ORDER — ONDANSETRON HCL 4 MG/2ML IJ SOLN: 4.0000 mg | Freq: Four times a day (QID) | INTRAMUSCULAR | Status: DC | PRN

## 2014-08-26 MED ORDER — TENOFOVIR DISOPROXIL FUMARATE 300 MG PO TABS: 300.0000 mg | ORAL_TABLET | ORAL | Status: DC

## 2014-08-26 MED ORDER — GABAPENTIN 100 MG PO CAPS: 100.0000 mg | ORAL_CAPSULE | Freq: Every day | ORAL | Status: DC

## 2014-08-26 MED ORDER — METOCLOPRAMIDE HCL 5 MG PO TABS
5.0000 mg | ORAL_TABLET | Freq: Three times a day (TID) | ORAL | Status: DC | PRN
  Filled 2014-08-26: qty 1

## 2014-08-26 MED ORDER — SODIUM CHLORIDE 0.9 % IV SOLN
Freq: Once | INTRAVENOUS | Status: AC
  Administered 2014-08-26: 21:00:00 via INTRAVENOUS

## 2014-08-26 MED ORDER — ONDANSETRON HCL 4 MG PO TABS: 4.0000 mg | ORAL_TABLET | Freq: Four times a day (QID) | ORAL | Status: DC | PRN

## 2014-08-26 MED ORDER — SODIUM CHLORIDE 0.9 % IJ SOLN
3.0000 mL | Freq: Two times a day (BID) | INTRAMUSCULAR | Status: DC
  Administered 2014-08-26 – 2014-08-28 (×4): 3 mL via INTRAVENOUS

## 2014-08-26 MED ORDER — CALCIUM ACETATE 667 MG PO CAPS
2668.0000 mg | ORAL_CAPSULE | Freq: Three times a day (TID) | ORAL | Status: DC
  Administered 2014-08-27 – 2014-08-28 (×5): 2668 mg via ORAL
  Filled 2014-08-26 (×7): qty 4

## 2014-08-26 MED ORDER — CALCIUM CARBONATE 1250 (500 CA) MG PO TABS
2.0000 | ORAL_TABLET | Freq: Three times a day (TID) | ORAL | Status: DC
  Administered 2014-08-27 – 2014-08-28 (×5): 1000 mg via ORAL
  Filled 2014-08-26 (×7): qty 2

## 2014-08-26 MED ORDER — ACETAMINOPHEN 650 MG RE SUPP: 650.0000 mg | Freq: Four times a day (QID) | RECTAL | Status: DC | PRN

## 2014-08-26 MED ORDER — GABAPENTIN 400 MG PO CAPS
400.0000 mg | ORAL_CAPSULE | Freq: Every day | ORAL | Status: DC
  Administered 2014-08-26 – 2014-08-27 (×2): 400 mg via ORAL
  Filled 2014-08-26 (×2): qty 1

## 2014-08-26 MED ORDER — SODIUM CHLORIDE 0.9 % IV BOLUS (SEPSIS): 500.0000 mL | Freq: Once | INTRAVENOUS | Status: DC

## 2014-08-26 MED ORDER — PANTOPRAZOLE SODIUM 40 MG IV SOLR
40.0000 mg | Freq: Once | INTRAVENOUS | Status: AC
  Administered 2014-08-26: 40 mg via INTRAVENOUS
  Filled 2014-08-26: qty 40

## 2014-08-26 NOTE — ED Notes (Signed)
Pt reports going to [facility] [place] yesterday with hemoglobin 6.4 and wanted to admit pt, but were unable to start IV and said pt could go to dialysis today to get blood. Pt did not get blood at dialysis today. Pt c/o weakness x2 weeks.

## 2014-08-26 NOTE — ED Provider Notes (Signed)
CSN: [id number]     Arrival date & time 08/26/14  1719 History   First MD Initiated Contact with Patient 08/26/14 1744     Chief Complaint  Patient presents with  . Weakness    HPI Comments: States he was seen at the [facility] [place] yesterday, told he needed a blood transfusion, however they were unable to obtain IV access - sent him home to follow up today at dialysis. They did not transfuse him at dialysis (unable to do so). Came here. Has been overall fatigued, generalized weakness for the last 2 weeks. States he has a history of multiple episodes of GI bleeding. Never sees blood in his bowel movements.   Patient is a 68 y.o. male presenting with weakness. The history is provided by the patient.  Weakness This is a recurrent problem. The current episode started 1 to 4 weeks ago (2 weeks ago). The problem occurs constantly. The problem has been gradually worsening. Pertinent negatives include no abdominal pain, chest pain, chills, diaphoresis, fever, nausea, numbness, rash, vertigo, vomiting or weakness. The symptoms are aggravated by standing, walking and exertion. He has tried nothing for the symptoms. Improvement on treatment: n/a.    Past Medical History  Diagnosis Date  . ESRD on peritoneal dialysis   . HIV disease   . Hypertension   . Shortness of breath   . DM type 2 causing renal disease   . Neuromuscular disorder     tremers  . Cancer     hx of rectal cancer  . Anemia 09/02/2012  . Hepatitis     hx of hepatitis B  . Iron (Fe) deficiency anemia 09/02/2012  . Peripheral vascular disease   . Osteomyelitis    Past Surgical History  Procedure Laterality Date  . Hemorroidectomy    . Insertion of dialysis catheter    . Hernia repair      TESTICLE  . Amputation of big toe Left april 2015   Family History  Problem Relation Age of Onset  . Diabetes Mother   . Cancer Mother     gallbladder  . Heart disease Father   . Cancer Sister     gallbladder   History  Substance Use Topics    . Smoking status: Former Smoker -- 0.25 packs/day for 50 years    Types: Cigarettes    Quit date: 01/08/2014  . Smokeless tobacco: Never Used  . Alcohol Use: No    Review of Systems  Constitutional: Negative for fever, chills and diaphoresis.  Respiratory: Negative for shortness of breath.   Cardiovascular: Negative for chest pain and palpitations.  Gastrointestinal: Negative for nausea, vomiting, abdominal pain and blood in stool (however, patient states he is always hemoccult positive).  Genitourinary: Negative for flank pain.  Musculoskeletal: Negative for back pain.  Skin: Negative for rash.  Neurological: Positive for light-headedness. Negative for vertigo, syncope, facial asymmetry, weakness and numbness.  All other systems reviewed and are negative.   Allergies  Codeine  Home Medications   Prior to Admission medications   Medication Sig Start Date End Date Taking? Authorizing Provider  allopurinol (ZYLOPRIM) 100 MG tablet Take 100 mg by mouth daily.    Historical Provider, MD  aspirin EC 81 MG tablet Take by mouth.    Historical Provider, MD  B Complex-C-Biotin-D-FA (NEPHROCAPS QT PO) Take by mouth.    Historical Provider, MD  calcium acetate (PHOSLO) 667 MG capsule Take 2,668 mg by mouth 3 (three) times daily with meals. Takes 4 capsules  Historical Provider, MD  calcium carbonate (OS-CAL - DOSED IN MG OF ELEMENTAL CALCIUM) 1250 MG tablet Take by mouth.    Historical Provider, MD  carvedilol (COREG) 6.25 MG tablet Take by mouth.    Historical Provider, MD  docusate sodium (COLACE) 100 MG capsule Take 100 mg by mouth 3 (three) times daily.     Historical Provider, MD  ferrous sulfate 325 (65 FE) MG tablet Take 325 mg by mouth 3 (three) times daily with meals.     Historical Provider, MD  ferrous sulfate 325 (65 FE) MG tablet Take by mouth.    Historical Provider, MD  fosamprenavir (LEXIVA) 700 MG tablet Take 700 mg by mouth 2 (two) times daily with a meal. 09/05/12    Eugenie Filler, MD  gabapentin (NEURONTIN) 100 MG capsule Take 100 mg by mouth daily.    Historical Provider, MD  insulin aspart (NOVOLOG) 100 UNIT/ML injection Inject into the skin.    Historical Provider, MD  insulin glargine (LANTUS) 100 UNIT/ML injection Inject 28 Units into the skin every morning. 09/05/12   Eugenie Filler, MD  insulin glargine (LANTUS) 100 UNIT/ML injection Inject into the skin.    Historical Provider, MD  insulin regular (NOVOLIN R,HUMULIN R) 100 units/mL injection Inject 14-22 Units into the skin See admin instructions. Pt takes anywhere from 14-22 units 3 times daily as directed per sliding scale. Pt also takes 18 units at night prior to home dialysis.    Historical Provider, MD  latanoprost (XALATAN) 0.005 % ophthalmic solution Place 1 drop into both eyes at bedtime.    Historical Provider, MD  loratadine (CLARITIN) 10 MG tablet Take 10 mg by mouth daily.    Historical Provider, MD  metoCLOPramide (REGLAN) 5 MG tablet Take 5 mg by mouth 3 (three) times daily.    Historical Provider, MD  multivitamin (RENA-VIT) TABS tablet Take 1 tablet by mouth daily.    Historical Provider, MD  pantoprazole (PROTONIX) 40 MG tablet Take 40 mg by mouth 2 (two) times daily.    Historical Provider, MD  polyvinyl alcohol (LIQUIFILM TEARS) 1.4 % ophthalmic solution Place 1 drop into both eyes 4 (four) times daily.    Historical Provider, MD  pravastatin (PRAVACHOL) 20 MG tablet Take 1 tablet (20 mg total) by mouth daily at 6 PM. 09/05/12   Eugenie Filler, MD  raltegravir (ISENTRESS) 400 MG tablet Take 400 mg by mouth 2 (two) times daily.    Historical Provider, MD  ritonavir (NORVIR) 100 MG capsule Take 100 mg by mouth 2 (two) times daily.    Historical Provider, MD  sildenafil (VIAGRA) 50 MG tablet Take 50 mg by mouth daily as needed. For erectile dysfunction    Historical Provider, MD  tenofovir (VIREAD) 300 MG tablet Take 300 mg by mouth every 7 (seven) days. Take on Tuesdays after  hemodialysis.    Historical Provider, MD   BP 76/59  Pulse 84  Temp(Src) 98.2 F (36.8 C) (Oral)  Resp 18  SpO2 100%  Physical Exam  Vitals reviewed. Constitutional: He is oriented to person, place, and time. He appears well-developed and well-nourished. No distress.  HENT:  Head: Normocephalic and atraumatic.  Right Ear: External ear normal.  Left Ear: External ear normal.  Mouth/Throat: Oropharynx is clear and moist.  Eyes: EOM are normal. Pupils are equal, round, and reactive to light.  Neck: Normal range of motion.  Cardiovascular: Normal rate and regular rhythm.   Pulmonary/Chest: Effort normal and breath sounds normal. No  respiratory distress. He has no wheezes. He has no rales.  Abdominal: Soft. He exhibits no distension. There is no tenderness. There is no rebound and no guarding.  Musculoskeletal:       Feet:  Neurological: He is alert and oriented to person, place, and time.  Skin: Skin is warm and dry. No rash noted. He is not diaphoretic.  Psychiatric: He has a normal mood and affect.    ED Course  Procedures (including critical care time) Labs Review  Results for orders placed during the hospital encounter of 08/26/14  CBC WITH DIFFERENTIAL      Result Value Ref Range   WBC 4.3  4.0 - 10.5 K/uL   RBC 2.37 (*) 4.22 - 5.81 MIL/uL   Hemoglobin 7.0 (*) 13.0 - 17.0 g/dL   HCT 23.5 (*) 39.0 - 52.0 %   MCV 99.2  78.0 - 100.0 fL   MCH 29.5  26.0 - 34.0 pg   MCHC 29.8 (*) 30.0 - 36.0 g/dL   RDW 16.8 (*) 11.5 - 15.5 %   Platelets 102 (*) 150 - 400 K/uL   Neutrophils Relative % 63  43 - 77 %   Neutro Abs 2.7  1.7 - 7.7 K/uL   Lymphocytes Relative 22  12 - 46 %   Lymphs Abs 0.9  0.7 - 4.0 K/uL   Monocytes Relative 13 (*) 3 - 12 %   Monocytes Absolute 0.6  0.1 - 1.0 K/uL   Eosinophils Relative 2  0 - 5 %   Eosinophils Absolute 0.1  0.0 - 0.7 K/uL   Basophils Relative 0  0 - 1 %   Basophils Absolute 0.0  0.0 - 0.1 K/uL  COMPREHENSIVE METABOLIC PANEL      Result  Value Ref Range   Sodium 140  137 - 147 mEq/L   Potassium 3.5 (*) 3.7 - 5.3 mEq/L   Chloride 96  96 - 112 mEq/L   CO2 31  19 - 32 mEq/L   Glucose, Bld 217 (*) 70 - 99 mg/dL   BUN 5 (*) 6 - 23 mg/dL   Creatinine, Ser 3.25 (*) 0.50 - 1.35 mg/dL   Calcium 8.6  8.4 - 10.5 mg/dL   Total Protein 6.9  6.0 - 8.3 g/dL   Albumin 2.7 (*) 3.5 - 5.2 g/dL   AST 15  0 - 37 U/L   ALT 7  0 - 53 U/L   Alkaline Phosphatase 119 (*) 39 - 117 U/L   Total Bilirubin 0.2 (*) 0.3 - 1.2 mg/dL   GFR calc non Af Amer 18 (*) >90 mL/min   GFR calc Af Amer 21 (*) >90 mL/min   Anion gap 13  5 - 15  POC OCCULT BLOOD, ED      Result Value Ref Range   Fecal Occult Bld POSITIVE (*) NEGATIVE  I-STAT TROPOININ, ED      Result Value Ref Range   Troponin i, poc 0.03  0.00 - 0.08 ng/mL   Comment 3           TYPE AND SCREEN      Result Value Ref Range   ABO/RH(D) O POS     Antibody Screen NEG     Sample Expiration 08/29/2014     Unit Number [id number]     Blood Component Type RED CELLS,LR     Unit division 00     Status of Unit ISSUED     Transfusion Status OK TO TRANSFUSE  Crossmatch Result Compatible    PREPARE RBC (CROSSMATCH)      Result Value Ref Range   Order Confirmation ORDER PROCESSED BY BLOOD BANK      Imaging Review Dg Chest Portable 1 View  08/26/2014   CLINICAL DATA:  Hypertension; rectal carcinoma  EXAM: PORTABLE CHEST - 1 VIEW  COMPARISON:  May 11, 2013  FINDINGS: There is no edema or consolidation. Heart is upper normal in size with pulmonary vascularity within normal limits. No adenopathy. No bone lesions. There is atherosclerotic change in the aortic arch region.  IMPRESSION: No edema or consolidation.   Electronically Signed   By: Lowella Grip M.D.   On: 08/26/2014 18:25    MDM   Final diagnoses:  Weakness  Symptomatic anemia    68 y.o. male with a past medical history of HIV, ESRD on HD, Hep B, HTN, DM2, Anemia, Osteomyelitis (currently on weekly IV treatment, thinks he has  3 more weeks to go). Presents with generalized weakness. History of symptomatic anemia secondary to GI bleeding. Was seen yesterday at the [place], Hgb reportedly less than 7.   Remainder of HPI, ROS, and Exam as above. He is awake, alert, mentating well, pleasant and cooperative. Initial BP recorded as Systolics 06T. Repeat with systolics in the 01S. Started IV fluids while awaiting blood.   Concern for symptomatic anemia, GI bleeding, anemia of CKD.  With overall weakness and malaise, EKG obtained, see results above. Troponin negative.   Labs obtained. Hgb 7, symptomatic with this. Will transfuse one unit. Hemoccult positive - will order second unit to be transfused.   Discussed his case with the hospitalist. Will admit him to the Stepdown unit due to his initial BP with systolics in the 01U.   The patient started receiving blood in the ED. He remained hemodynamically stable in the ED. He was transferred to the [facility] unit.   This case managed in conjunction with my attending, Dr. Venora Maples.     Berenice Primas, MD 08/27/14 [phone]

## 2014-08-26 NOTE — ED Notes (Signed)
Attempted report x1. 

## 2014-08-26 NOTE — ED Notes (Signed)
Attempted report x 2 

## 2014-08-26 NOTE — H&P (Signed)
[facility] History and Physical  Patient: Benjamin Long  ZMO:[id number]  DOB: [birth year]  DOS: the patient was seen and examined on 08/26/2014 PCP: Maggie Font, MD  Chief Complaint: Generalized weakness  HPI: Benjamin Long is a 68 y.o. male with Past medical history of ESRD on hemodialysis, GI bleeding, diabetes mellitus, anemia, peripheral vascular disease, osteomyelitis. The patient is presenting with complaints of anemia. He mentions that she has history of anemia and has been recently admitted 3 weeks ago at the [place] for GI bleed secondary to stomach ulcer which was treated with with endoscopy. He was on Protonix twice a day and continues to take all other medication. He also has been receiving iron supplements with his hemodialysis. He has been receiving to antibiotics since last 3 weeks for his osteomyelitis. He mentions he has a recurrent GI bleed and has multiple procedures. At present he denies any active bleeding through his bowels no black color bowel movement and no bright red blood per rectum. He denies any bleeding anywhere else. He denies taking any NSAIDS. His only complaint is generalized weakness and abnormal lab.   The patient is coming from home. And at his baseline independent for most of his ADL.  Review of Systems: as mentioned in the history of present illness.  A Comprehensive review of the other systems is negative.  Past Medical History  Diagnosis Date  . ESRD on peritoneal dialysis   . HIV disease   . Hypertension   . Shortness of breath   . DM type 2 causing renal disease   . Neuromuscular disorder     tremers  . Cancer     hx of rectal cancer  . Anemia 09/02/2012  . Hepatitis     hx of hepatitis B  . Iron (Fe) deficiency anemia 09/02/2012  . Peripheral vascular disease   . Osteomyelitis    Past Surgical History  Procedure Laterality Date  . Hemorroidectomy    . Insertion of dialysis catheter    . Hernia repair      TESTICLE   . Amputation of big toe Left april 2015   Social History:  reports that he quit smoking about 7 months ago. His smoking use included Cigarettes. He has a 12.5 pack-year smoking history. He has never used smokeless tobacco. He reports that he does not drink alcohol or use illicit drugs.  Allergies  Allergen Reactions  . Codeine Nausea And Vomiting and Nausea Only    Family History  Problem Relation Age of Onset  . Diabetes Mother   . Cancer Mother     gallbladder  . Heart disease Father   . Cancer Sister     gallbladder    Prior to Admission medications   Medication Sig Start Date End Date Taking? Authorizing Provider  allopurinol (ZYLOPRIM) 100 MG tablet Take 100 mg by mouth daily.   Yes Historical Provider, MD  aspirin EC 81 MG tablet Take 81 mg by mouth daily.    Yes Historical Provider, MD  calcium acetate (PHOSLO) 667 MG capsule Take 2,668 mg by mouth 3 (three) times daily with meals. Takes 4 capsules   Yes Historical Provider, MD  calcium carbonate (OS-CAL - DOSED IN MG OF ELEMENTAL CALCIUM) 1250 MG tablet Take 2 tablets by mouth 3 (three) times daily with meals.    Yes Historical Provider, MD  carvedilol (COREG) 6.25 MG tablet Take 6.25 mg by mouth 2 (two) times daily with a meal.    Yes Historical Provider, MD  fosamprenavir (LEXIVA) 700 MG tablet Take 700 mg by mouth 2 (two) times daily with a meal. 09/05/12  Yes Eugenie Filler, MD  gabapentin (NEURONTIN) 100 MG capsule Take 100 mg by mouth daily.   Yes Historical Provider, MD  HYDROcodone-acetaminophen (NORCO/VICODIN) 5-325 MG per tablet Take 1 tablet by mouth every 6 (six) hours as needed for moderate pain.   Yes Historical Provider, MD  insulin glargine (LANTUS) 100 UNIT/ML injection Inject 2-6 Units into the skin daily.    Yes Historical Provider, MD  insulin regular (NOVOLIN R,HUMULIN R) 100 units/mL injection Inject 6-14 Units into the skin See admin instructions. Pt takes anywhere from 6-14 units 3 times daily as  directed per sliding scale. Pt also takes 18 units at night prior to home dialysis.   Yes Historical Provider, MD  latanoprost (XALATAN) 0.005 % ophthalmic solution Place 1 drop into both eyes at bedtime.   Yes Historical Provider, MD  metoCLOPramide (REGLAN) 5 MG tablet Take 5 mg by mouth every 8 (eight) hours as needed for nausea or vomiting.    Yes Historical Provider, MD  multivitamin (RENA-VIT) TABS tablet Take 1 tablet by mouth daily.   Yes Historical Provider, MD  pantoprazole (PROTONIX) 40 MG tablet Take 40 mg by mouth 2 (two) times daily.   Yes Historical Provider, MD  polyvinyl alcohol (LIQUIFILM TEARS) 1.4 % ophthalmic solution Place 1 drop into both eyes 4 (four) times daily.   Yes Historical Provider, MD  pravastatin (PRAVACHOL) 20 MG tablet Take 20 mg by mouth every evening.   Yes Historical Provider, MD  raltegravir (ISENTRESS) 400 MG tablet Take 400 mg by mouth 2 (two) times daily.   Yes Historical Provider, MD  ritonavir (NORVIR) 100 MG capsule Take 100 mg by mouth 2 (two) times daily.   Yes Historical Provider, MD  sildenafil (VIAGRA) 50 MG tablet Take 50 mg by mouth daily as needed. For erectile dysfunction   Yes Historical Provider, MD  tenofovir (VIREAD) 300 MG tablet Take 300 mg by mouth every 7 (seven) days. Take on Tuesdays after hemodialysis.   Yes Historical Provider, MD  vancomycin in sodium chloride 0.9 % 500 mL Inject into the vein every 12 (twelve) hours.   Yes Historical Provider, MD    Physical Exam: Filed Vitals:   08/26/14 1915 08/26/14 1920 08/26/14 1923 08/26/14 1945  BP: 99/48 101/59 99/54 104/50  Pulse: 85  80 81  Temp:   98.5 F (36.9 C)   TempSrc:   Oral   Resp: 15  16   SpO2: 99%  99% 99%    General: Alert, Awake and Oriented to Time, Place and Person. Appear in mild distress Eyes: PERRL ENT: Oral Mucosa clear moist. Neck: no JVD Cardiovascular: S1 and S2 Present, aortic systolic Murmur, Peripheral Pulses Present Respiratory: Bilateral Air entry  equal and Decreased, Clear to Auscultation, noCrackles, no wheezes Abdomen: Bowel Sound present, Soft and non tender Skin: no Rash Extremities: no Pedal edema, no calf tenderness Neurologic: Grossly no focal neuro deficit.  Labs on Admission:  CBC:  Recent Labs Lab 08/26/14 1736  WBC 4.3  NEUTROABS 2.7  HGB 7.0*  HCT 23.5*  MCV 99.2  PLT 102*    CMP     Component Value Date/Time   NA 140 08/26/2014 1736   K 3.5* 08/26/2014 1736   CL 96 08/26/2014 1736   CO2 31 08/26/2014 1736   GLUCOSE 217* 08/26/2014 1736   BUN 5* 08/26/2014 1736   CREATININE 3.25* 08/26/2014 1736  CALCIUM 8.6 08/26/2014 1736   PROT 6.9 08/26/2014 1736   ALBUMIN 2.7* 08/26/2014 1736   AST 15 08/26/2014 1736   ALT 7 08/26/2014 1736   ALKPHOS 119* 08/26/2014 1736   BILITOT 0.2* 08/26/2014 1736   GFRNONAA 18* 08/26/2014 1736   GFRAA 21* 08/26/2014 1736    No results found for this basename: LIPASE, AMYLASE,  in the last 168 hours No results found for this basename: AMMONIA,  in the last 168 hours  No results found for this basename: CKTOTAL, CKMB, CKMBINDEX, TROPONINI,  in the last 168 hours BNP (last 3 results) No results found for this basename: PROBNP,  in the last 8760 hours  Radiological Exams on Admission: Dg Chest Portable 1 View  08/26/2014   CLINICAL DATA:  Hypertension; rectal carcinoma  EXAM: PORTABLE CHEST - 1 VIEW  COMPARISON:  May 11, 2013  FINDINGS: There is no edema or consolidation. Heart is upper normal in size with pulmonary vascularity within normal limits. No adenopathy. No bone lesions. There is atherosclerotic change in the aortic arch region.  IMPRESSION: No edema or consolidation.   Electronically Signed   By: Lowella Grip M.D.   On: 08/26/2014 18:25     Assessment/Plan Principal Problem:   Symptomatic anemia Active Problems:   HIV disease   Hypertension   DM type 2 causing renal disease   Iron (Fe) deficiency anemia   Hepatitis   ESRD (end stage renal  disease)   GI bleed   1. Symptomatic anemia The patient is presenting with complaints of abnormal labs with hemoglobin low. He has been diagnosed with iron deficiency anemia as well as anemia of chronic renal disease. He also had GI bleed in the past. At present his Hemoccult is positive. He was initially hypotensive but he always has low blood pressure. At present patient will be admitted in the stepdown unit. I would give him 1 unit of PRBC and follow his H&H if there is further drop patient will require further PRBC. He has thrombocytopenia which appears chronic. At present will continue to monitor. Hold his aspirin. IV Protonix. There is a drop in hemoglobin patient will require GI consultation. At present clear liquid diet if there is drop in hemoglobin with changed to nothing by mouth.  2. Diabetes mellitus. Patient will be placed on sliding scale.  3. ESRD on hemodialysis. Last hemodialysis on Thursday. Patient on Tuesday Thursday Saturday protocol. Denies any issues with his hemodialysis although mentions he gets heparin with his hemodialysis. Which may need to be stopped.  4. HIV disease. Continue home medications.  5. Osteomyelitis of the left foot. Patient is getting 2 antibiotics as an outpatient the name of which he does not remember. We will get further information in the morning.  Advance goals of care discussion: Full code   DVT Prophylaxis: mechanical compression device Nutrition: Clear liquid diet  Disposition: Admitted to inpatient in step-down unit.  Author: Berle Mull, MD [facility] Pager: [phone] 08/26/2014, 8:47 PM    If 7PM-7AM, please contact night-coverage [email or web address] Password [email or web address]

## 2014-08-27 DIAGNOSIS — K922 Gastrointestinal hemorrhage, unspecified: Secondary | ICD-10-CM | POA: Diagnosis present

## 2014-08-27 LAB — CBC
HCT: 22.4 % — ABNORMAL LOW (ref 39.0–52.0)
HCT: 29.4 % — ABNORMAL LOW (ref 39.0–52.0)
Hemoglobin: 6.8 g/dL — CL (ref 13.0–17.0)
Hemoglobin: 9.1 g/dL — ABNORMAL LOW (ref 13.0–17.0)
MCH: 28.6 pg (ref 26.0–34.0)
MCH: 29.2 pg (ref 26.0–34.0)
MCHC: 30.4 g/dL (ref 30.0–36.0)
MCHC: 31 g/dL (ref 30.0–36.0)
MCV: 94.1 fL (ref 78.0–100.0)
MCV: 94.2 fL (ref 78.0–100.0)
PLATELETS: 84 10*3/uL — AB (ref 150–400)
PLATELETS: 87 10*3/uL — AB (ref 150–400)
RBC: 2.38 MIL/uL — ABNORMAL LOW (ref 4.22–5.81)
RBC: 3.12 MIL/uL — AB (ref 4.22–5.81)
RDW: 17.6 % — AB (ref 11.5–15.5)
RDW: 17.6 % — ABNORMAL HIGH (ref 11.5–15.5)
WBC: 3.5 10*3/uL — AB (ref 4.0–10.5)
WBC: 4.5 10*3/uL (ref 4.0–10.5)

## 2014-08-27 LAB — GLUCOSE, CAPILLARY
GLUCOSE-CAPILLARY: 173 mg/dL — AB (ref 70–99)
Glucose-Capillary: 104 mg/dL — ABNORMAL HIGH (ref 70–99)
Glucose-Capillary: 147 mg/dL — ABNORMAL HIGH (ref 70–99)
Glucose-Capillary: 172 mg/dL — ABNORMAL HIGH (ref 70–99)

## 2014-08-27 LAB — COMPREHENSIVE METABOLIC PANEL
ALT: 6 U/L (ref 0–53)
AST: 13 U/L (ref 0–37)
Albumin: 2.3 g/dL — ABNORMAL LOW (ref 3.5–5.2)
Alkaline Phosphatase: 106 U/L (ref 39–117)
Anion gap: 8 (ref 5–15)
BILIRUBIN TOTAL: 0.4 mg/dL (ref 0.3–1.2)
BUN: 8 mg/dL (ref 6–23)
CALCIUM: 8.1 mg/dL — AB (ref 8.4–10.5)
CO2: 31 meq/L (ref 19–32)
CREATININE: 3.92 mg/dL — AB (ref 0.50–1.35)
Chloride: 100 mEq/L (ref 96–112)
GFR, EST AFRICAN AMERICAN: 17 mL/min — AB (ref >90)
GFR, EST NON AFRICAN AMERICAN: 14 mL/min — AB (ref >90)
GLUCOSE: 135 mg/dL — AB (ref 70–99)
Potassium: 4.2 mEq/L (ref 3.7–5.3)
Sodium: 139 mEq/L (ref 137–147)
Total Protein: 6.2 g/dL (ref 6.0–8.3)

## 2014-08-27 LAB — MRSA PCR SCREENING: MRSA by PCR: NEGATIVE

## 2014-08-27 LAB — PREPARE RBC (CROSSMATCH)

## 2014-08-27 LAB — PROTIME-INR
INR: 1.16 (ref 0.00–1.49)
Prothrombin Time: 15 seconds (ref 11.6–15.2)

## 2014-08-27 MED ORDER — GABAPENTIN 600 MG PO TABS
600.0000 mg | ORAL_TABLET | Freq: Every day | ORAL | Status: DC
  Administered 2014-08-27: 600 mg via ORAL
  Filled 2014-08-27 (×2): qty 1

## 2014-08-27 MED ORDER — PANTOPRAZOLE SODIUM 40 MG PO TBEC
40.0000 mg | DELAYED_RELEASE_TABLET | Freq: Two times a day (BID) | ORAL | Status: DC
  Administered 2014-08-27 – 2014-08-28 (×3): 40 mg via ORAL
  Filled 2014-08-27 (×2): qty 1

## 2014-08-27 MED ORDER — INSULIN ASPART 100 UNIT/ML ~~LOC~~ SOLN: 0.0000 [IU] | Freq: Four times a day (QID) | SUBCUTANEOUS | Status: DC

## 2014-08-27 MED ORDER — LATANOPROST 0.005 % OP SOLN
1.0000 [drp] | Freq: Every day | OPHTHALMIC | Status: DC
  Administered 2014-08-27: 1 [drp] via OPHTHALMIC
  Filled 2014-08-27: qty 2.5

## 2014-08-27 MED ORDER — NORTRIPTYLINE HCL 25 MG PO CAPS: 50.0000 mg | ORAL_CAPSULE | Freq: Every day | ORAL | Status: DC

## 2014-08-27 MED ORDER — CARVEDILOL 3.125 MG PO TABS
3.1250 mg | ORAL_TABLET | Freq: Every day | ORAL | Status: DC
  Administered 2014-08-27 – 2014-08-28 (×2): 3.125 mg via ORAL
  Filled 2014-08-27 (×2): qty 1

## 2014-08-27 MED ORDER — PRAVASTATIN SODIUM 20 MG PO TABS: 20.0000 mg | ORAL_TABLET | Freq: Every evening | ORAL | Status: DC

## 2014-08-27 MED ORDER — INSULIN ASPART 100 UNIT/ML ~~LOC~~ SOLN
0.0000 [IU] | Freq: Three times a day (TID) | SUBCUTANEOUS | Status: DC
  Administered 2014-08-27: 2 [IU] via SUBCUTANEOUS
  Administered 2014-08-27: 3 [IU] via SUBCUTANEOUS
  Administered 2014-08-28 (×2): 2 [IU] via SUBCUTANEOUS

## 2014-08-27 MED ORDER — POLYVINYL ALCOHOL 1.4 % OP SOLN
1.0000 [drp] | Freq: Four times a day (QID) | OPHTHALMIC | Status: DC
  Administered 2014-08-27 – 2014-08-28 (×4): 1 [drp] via OPHTHALMIC
  Filled 2014-08-27: qty 15

## 2014-08-27 MED ORDER — SIMETHICONE 80 MG PO CHEW
240.0000 mg | CHEWABLE_TABLET | Freq: Four times a day (QID) | ORAL | Status: DC | PRN
  Filled 2014-08-27: qty 3

## 2014-08-27 MED ORDER — GABAPENTIN 300 MG PO CAPS
300.0000 mg | ORAL_CAPSULE | Freq: Every day | ORAL | Status: DC
  Filled 2014-08-27: qty 1

## 2014-08-27 MED ORDER — CAMPHOR-MENTHOL 0.5-0.5 % EX LOTN
1.0000 "application " | TOPICAL_LOTION | Freq: Three times a day (TID) | CUTANEOUS | Status: DC | PRN
  Filled 2014-08-27: qty 222

## 2014-08-27 MED ORDER — FOSAMPRENAVIR CALCIUM 700 MG PO TABS
700.0000 mg | ORAL_TABLET | Freq: Two times a day (BID) | ORAL | Status: DC
  Administered 2014-08-27 – 2014-08-28 (×3): 700 mg via ORAL
  Filled 2014-08-27 (×2): qty 1

## 2014-08-27 MED ORDER — CARVEDILOL 3.125 MG PO TABS
3.1250 mg | ORAL_TABLET | ORAL | Status: DC
  Filled 2014-08-27: qty 1

## 2014-08-27 MED ORDER — CARVEDILOL 3.125 MG PO TABS
3.1250 mg | ORAL_TABLET | Freq: Two times a day (BID) | ORAL | Status: DC
  Filled 2014-08-27 (×2): qty 1

## 2014-08-27 MED ORDER — RENA-VITE PO TABS
1.0000 | ORAL_TABLET | Freq: Every day | ORAL | Status: DC
  Administered 2014-08-27: 1 via ORAL
  Filled 2014-08-27 (×3): qty 1

## 2014-08-27 MED ORDER — GABAPENTIN 300 MG PO CAPS: 300.0000 mg | ORAL_CAPSULE | Freq: Two times a day (BID) | ORAL | Status: DC

## 2014-08-27 MED ORDER — OXYCODONE HCL 5 MG PO TABS
5.0000 mg | ORAL_TABLET | Freq: Four times a day (QID) | ORAL | Status: DC | PRN
  Administered 2014-08-27: 5 mg via ORAL
  Administered 2014-08-27: 10 mg via ORAL
  Filled 2014-08-27: qty 2
  Filled 2014-08-27: qty 1

## 2014-08-27 MED ORDER — GABAPENTIN 100 MG PO CAPS
100.0000 mg | ORAL_CAPSULE | Freq: Every day | ORAL | Status: DC
  Administered 2014-08-27: 100 mg via ORAL
  Filled 2014-08-27: qty 1

## 2014-08-27 NOTE — ED Provider Notes (Signed)
I saw and evaluated the patient, reviewed the resident's note and I agree with the findings and plan.   EKG Interpretation None      Symptomatically anemia.  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  Hemodynamically stable but symptomatic.  Blood transfusion now.  Admission to the hospital.  CRITICAL CARE Performed by: Hoy Morn Total critical care time: 33 Critical care time was exclusive of separately billable procedures and treating other patients. Critical care was necessary to treat or prevent imminent or life-threatening deterioration. Critical care was time spent personally by me on the following activities: development of treatment plan with patient and/or surrogate as well as nursing, discussions with consultants, evaluation of patient's response to treatment, examination of patient, obtaining history from patient or surrogate, ordering and performing treatments and interventions, ordering and review of laboratory studies, ordering and review of radiographic studies, pulse oximetry and re-evaluation of patient's condition.   Hoy Morn, MD 08/27/14 [phone]

## 2014-08-27 NOTE — Progress Notes (Signed)
Report called to [facility] on 6E.

## 2014-08-27 NOTE — Progress Notes (Signed)
Utilization review completed.  

## 2014-08-27 NOTE — Progress Notes (Signed)
[place] TEAM 1 - Stepdown/ICU TEAM Progress Note  Benjamin Long YIF:[id number] DOB: Dec 02, 1945 DOA: 08/26/2014 PCP: Maggie Font, MD  Admit HPI / Brief Narrative: 68 y.o. male with history of ESRD on hemodialysis, recurrent GI bleeding, diabetes mellitus, chronic anemia, peripheral vascular disease, and osteomyelitis who presented stating he was seen at the [place] the day prior where he was told he needed a blood transfusion, however they were unable to obtain IV access.  He was therefore sent home to follow up at dialysis. For unclear reasons he was not transfused at dialysis, and subsequently presented to the ED, c/o generalized weakness for 2 weeks.  Pt reports a history of multiple episodes of GI bleeding, though he ever sees blood in his bowel movements.  He denies melena or hematochezia again this presentation.  He has a history of chronic anemia and was admitted 3 weeks prior at the [place] for GI bleed secondary to stomach ulcer which was treated with with endoscopy.  He was on Protonix twice a day and continues to take all other medication. He also has been receiving iron supplements with his hemodialysis.  He has been receiving antibiotics for 3 weeks for osteomyelitis.   HPI/Subjective: Pt states he feels "a little better."  No new complaints this morning.  Has not yet been out of bed.  Denies cp, or sob.    Assessment/Plan:  Acute on chronic symptomatic anemia due to chronic blood loss and CKD Hemoccult reportedly positive in ED - s/p 3U PRBC total - Hgb nadir 6.8 - baseline Hgb appears to be ~9.0 - review of old records notes pt is followed by Hematology through the [place], and has had multiple prior GI evaluations - will not pursue repeat GI evals this hospitalization unless Hgb proves to drop precipitously   Thrombocytopenia Baseline plt count appears to be ~140 - follow trend   ESRD on Tu/Th/Sat schedule  Consult Nephrology to attend to inpt HD - watch volume status given high  volume of transfused blood - presently no signs of volume overload   HIV+ Cont usual outpt meds  Osteomyelitis of the left foot Resume usual outpt abx regimen (pt tells me his abx are given w/ his HD treatments - will ask Nephrology to attend to agent and dose)  HTN  Resume partial dose of home BB and follow   DM w/ renal complications  CBG reasonably controlled at this time  Hx of rectal CA  Code Status: FULL Family Communication: no family present at time of exam Disposition Plan: stable for transfer to renal floor - follow Hgb - begin PT/OT - follow volume carefully   Consultants: Nephrology   Procedures: none  Antibiotics: none  DVT prophylaxis: SCDs  Objective: Blood pressure 131/62, pulse 77, temperature 97.8 F (36.6 C), temperature source Oral, resp. rate 10, height 5\' 11"  (1.803 m), weight 94 kg (207 lb 3.7 oz), SpO2 98.00%.  Intake/Output Summary (Last 24 hours) at 08/27/14 0948 Last data filed at 08/27/14 8676  Gross per 24 hour  Intake    855 ml  Output      0 ml  Net    855 ml    Exam: General: No acute respiratory distress Lungs: Clear to auscultation bilaterally without wheezes or crackles Cardiovascular: Regular rate and rhythm without murmur gallop or rub normal S1 and S2 Abdomen: Nontender, nondistended, soft, bowel sounds positive, no rebound, no ascites, no appreciable mass Extremities: No significant cyanosis, clubbing, or edema bilateral lower extremities - no d/c  or erythema at R foot former location of great toe   Data Reviewed: Basic Metabolic Panel:  Recent Labs Lab 08/26/14 1736 08/27/14 0245  NA 140 139  K 3.5* 4.2  CL 96 100  CO2 31 31  GLUCOSE 217* 135*  BUN 5* 8  CREATININE 3.25* 3.92*  CALCIUM 8.6 8.1*    Liver Function Tests:  Recent Labs Lab 08/26/14 1736 08/27/14 0245  AST 15 13  ALT 7 6  ALKPHOS 119* 106  BILITOT 0.2* 0.4  PROT 6.9 6.2  ALBUMIN 2.7* 2.3*   Coags:  Recent Labs Lab 08/27/14 0245    INR 1.16   No results found for this basename: APTT,  in the last 168 hours  CBC:  Recent Labs Lab 08/26/14 1736 08/27/14 0245  WBC 4.3 3.5*  NEUTROABS 2.7  --   HGB 7.0* 6.8*  HCT 23.5* 22.4*  MCV 99.2 94.1  PLT 102* 84*    CBG:  Recent Labs Lab 08/26/14 2129 08/27/14 0621  GLUCAP 258* 104*    Recent Results (from the past 240 hour(s))  MRSA PCR SCREENING     Status: None   Collection Time    08/27/14  6:30 AM      Result Value Ref Range Status   MRSA by PCR NEGATIVE  NEGATIVE Final   Comment:            The GeneXpert MRSA Assay (FDA     approved for NASAL specimens     only), is one component of a     comprehensive MRSA colonization     surveillance program. It is not     intended to diagnose MRSA     infection nor to guide or     monitor treatment for     MRSA infections.     Studies:  Recent x-ray studies have been reviewed in detail by the Attending Physician  Scheduled Meds:  Scheduled Meds: . sodium chloride   Intravenous Once  . allopurinol  100 mg Oral Daily  . calcium acetate  2,668 mg Oral TID WC  . calcium carbonate  2 tablet Oral TID WC  . fosamprenavir  700 mg Oral BID WC  . gabapentin  400 mg Oral Daily  . insulin aspart  0-15 Units Subcutaneous 4 times per day  . nortriptyline  50 mg Oral QHS  . pravastatin  20 mg Oral QPM  . raltegravir  400 mg Oral BID  . ritonavir  100 mg Oral BID WC  . sodium chloride  3 mL Intravenous Q12H  . [START ON 09/02/2014] tenofovir  300 mg Oral Q7 days    Time spent on care of this patient: 35 mins   MCCLUNG,JEFFREY T , MD   [facility] Office  [phone] Pager - Text Page per Shea Evans as per below:  On-Call/Text Page:      [email or web address]      password [email or web address]  If 7PM-7AM, please contact night-coverage [email or web address] Password [email or web address] 08/27/2014, 9:48 AM   LOS: 1 day

## 2014-08-27 NOTE — Progress Notes (Signed)
Attempted to call report

## 2014-08-28 DIAGNOSIS — E1122 Type 2 diabetes mellitus with diabetic chronic kidney disease: Secondary | ICD-10-CM

## 2014-08-28 DIAGNOSIS — N189 Chronic kidney disease, unspecified: Secondary | ICD-10-CM

## 2014-08-28 LAB — RENAL FUNCTION PANEL
Albumin: 2.4 g/dL — ABNORMAL LOW (ref 3.5–5.2)
Anion gap: 12 (ref 5–15)
BUN: 15 mg/dL (ref 6–23)
CALCIUM: 9.2 mg/dL (ref 8.4–10.5)
CO2: 28 meq/L (ref 19–32)
CREATININE: 5.94 mg/dL — AB (ref 0.50–1.35)
Chloride: 98 mEq/L (ref 96–112)
GFR calc Af Amer: 10 mL/min — ABNORMAL LOW (ref >90)
GFR calc non Af Amer: 9 mL/min — ABNORMAL LOW (ref >90)
GLUCOSE: 137 mg/dL — AB (ref 70–99)
Phosphorus: 3.6 mg/dL (ref 2.3–4.6)
Potassium: 4.9 mEq/L (ref 3.7–5.3)
Sodium: 138 mEq/L (ref 137–147)

## 2014-08-28 LAB — RETICULOCYTES
RBC.: 3.21 MIL/uL — ABNORMAL LOW (ref 4.22–5.81)
Retic Count, Absolute: 157.3 10*3/uL (ref 19.0–186.0)
Retic Ct Pct: 4.9 % — ABNORMAL HIGH (ref 0.4–3.1)

## 2014-08-28 LAB — CBC
HEMATOCRIT: 29.7 % — AB (ref 39.0–52.0)
Hemoglobin: 9.2 g/dL — ABNORMAL LOW (ref 13.0–17.0)
MCH: 28.8 pg (ref 26.0–34.0)
MCHC: 31 g/dL (ref 30.0–36.0)
MCV: 92.8 fL (ref 78.0–100.0)
Platelets: 103 10*3/uL — ABNORMAL LOW (ref 150–400)
RBC: 3.2 MIL/uL — AB (ref 4.22–5.81)
RDW: 17.1 % — AB (ref 11.5–15.5)
WBC: 4.5 10*3/uL (ref 4.0–10.5)

## 2014-08-28 LAB — TYPE AND SCREEN
ABO/RH(D): O POS
Antibody Screen: NEGATIVE
UNIT DIVISION: 0
UNIT DIVISION: 0
Unit division: 0

## 2014-08-28 LAB — GLUCOSE, CAPILLARY
GLUCOSE-CAPILLARY: 191 mg/dL — AB (ref 70–99)
Glucose-Capillary: 123 mg/dL — ABNORMAL HIGH (ref 70–99)
Glucose-Capillary: 139 mg/dL — ABNORMAL HIGH (ref 70–99)

## 2014-08-28 LAB — FOLATE: Folate: >20 ng/mL

## 2014-08-28 LAB — VITAMIN B12: Vitamin B-12: 1052 pg/mL — ABNORMAL HIGH (ref 211–911)

## 2014-08-28 LAB — IRON AND TIBC
IRON: 101 ug/dL (ref 42–135)
Saturation Ratios: 36 % (ref 20–55)
TIBC: 277 ug/dL (ref 215–435)
UIBC: 176 ug/dL (ref 125–400)

## 2014-08-28 LAB — FERRITIN: FERRITIN: 108 ng/mL (ref 22–322)

## 2014-08-28 MED ORDER — DARBEPOETIN ALFA-POLYSORBATE 200 MCG/0.4ML IJ SOLN
INTRAMUSCULAR | Status: AC
  Administered 2014-08-28: 200 ug via INTRAVENOUS
  Filled 2014-08-28: qty 0.4

## 2014-08-28 MED ORDER — ALTEPLASE 2 MG IJ SOLR
2.0000 mg | Freq: Once | INTRAMUSCULAR | Status: DC | PRN
  Filled 2014-08-28: qty 2

## 2014-08-28 MED ORDER — HYDRALAZINE HCL 50 MG PO TABS
50.0000 mg | ORAL_TABLET | Freq: Three times a day (TID) | ORAL | Status: DC
  Administered 2014-08-28: 50 mg via ORAL
  Filled 2014-08-28 (×3): qty 1

## 2014-08-28 MED ORDER — PENTAFLUOROPROP-TETRAFLUOROETH EX AERO
1.0000 | INHALATION_SPRAY | CUTANEOUS | Status: DC | PRN
Start: 2014-08-28 — End: 2014-08-28

## 2014-08-28 MED ORDER — LIDOCAINE-PRILOCAINE 2.5-2.5 % EX CREA: 1.0000 "application " | TOPICAL_CREAM | CUTANEOUS | Status: DC | PRN

## 2014-08-28 MED ORDER — DARBEPOETIN ALFA-POLYSORBATE 200 MCG/0.4ML IJ SOLN
200.0000 ug | INTRAMUSCULAR | Status: DC
  Administered 2014-08-28: 200 ug via INTRAVENOUS
  Filled 2014-08-28: qty 0.4

## 2014-08-28 MED ORDER — DOXERCALCIFEROL 4 MCG/2ML IV SOLN
INTRAVENOUS | Status: AC
  Administered 2014-08-28: 1 ug via INTRAVENOUS
  Filled 2014-08-28: qty 2

## 2014-08-28 MED ORDER — DOXERCALCIFEROL 4 MCG/2ML IV SOLN
1.0000 ug | INTRAVENOUS | Status: DC
  Administered 2014-08-28: 1 ug via INTRAVENOUS
  Filled 2014-08-28: qty 2

## 2014-08-28 MED ORDER — HEPARIN SODIUM (PORCINE) 1000 UNIT/ML DIALYSIS
1000.0000 [IU] | INTRAMUSCULAR | Status: DC | PRN
  Filled 2014-08-28: qty 1

## 2014-08-28 MED ORDER — LIDOCAINE HCL (PF) 1 % IJ SOLN: 5.0000 mL | INTRAMUSCULAR | Status: DC | PRN

## 2014-08-28 MED ORDER — SEVELAMER CARBONATE 800 MG PO TABS
2400.0000 mg | ORAL_TABLET | Freq: Three times a day (TID) | ORAL | Status: DC
  Administered 2014-08-28: 2400 mg via ORAL
  Filled 2014-08-28 (×3): qty 3

## 2014-08-28 MED ORDER — DEXTROSE 5 % IV SOLN
2.0000 g | Freq: Once | INTRAVENOUS | Status: AC
  Administered 2014-08-28: 2 g via INTRAVENOUS
  Filled 2014-08-28: qty 2

## 2014-08-28 MED ORDER — VANCOMYCIN HCL IN DEXTROSE 1-5 GM/200ML-% IV SOLN
1000.0000 mg | Freq: Once | INTRAVENOUS | Status: AC
  Administered 2014-08-28: 1000 mg via INTRAVENOUS
  Filled 2014-08-28: qty 200

## 2014-08-28 MED ORDER — SODIUM CHLORIDE 0.9 % IV SOLN: 100.0000 mL | INTRAVENOUS | Status: DC | PRN

## 2014-08-28 MED ORDER — NEPRO/CARBSTEADY PO LIQD: 237.0000 mL | ORAL | Status: DC | PRN

## 2014-08-28 NOTE — Procedures (Signed)
I was present at this dialysis session. I have reviewed the session itself and made appropriate changes.   Pearson Grippe  MD 08/28/2014, 12:55 PM

## 2014-08-28 NOTE — Consult Note (Signed)
[facility] Renal Consultation Note    Indication for Consultation:  Management of ESRD/hemodialysis; anemia, hypertension/volume and secondary hyperparathyroidism PCP:  HPI: Benjamin Long is a 68 y.o. 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  He had an admission earlier this month at the [place] for GIB secondary to gastropathy and multiple AVMs, Hgb. 7.1 before admission and 8.2 10/8 when he returned to [facility] the 8.3 10/15, 8.1 10/22 with drop to 6.9 10/29 at which time he presented to the ED.  Fe studies were drawn with monthly labs 10/29 but cancelled.  He has been on venofer 50 per week. He has felt cold and weak and really doesn't feel that much better today even after transfusions with Hgb up to 9.  Appetite is never good. He usually is able to drive himself to dialysis and walks into with a cane.  He denies sob. He reminds me he is getting IV antibiotics (Vanco and Fortaz) osteo on left foot. He has chronic bilateral LE numbess  Past Medical History  Diagnosis Date  . ESRD on peritoneal dialysis   . HIV disease   . Hypertension   . Shortness of breath   . DM type 2 causing renal disease   . Neuromuscular disorder     tremers  . Cancer     hx of rectal cancer  . Anemia 09/02/2012  . Hepatitis     hx of hepatitis B  . Iron (Fe) deficiency anemia 09/02/2012  . Peripheral vascular disease   . Osteomyelitis    Past Surgical History  Procedure Laterality Date  . Hemorroidectomy    . Insertion of dialysis catheter    . Hernia repair      TESTICLE  . Amputation of big toe Left april 2015   Family History  Problem Relation Age of Onset  . Diabetes Mother   . Cancer Mother     gallbladder  . Heart disease Father   . Cancer Sister     gallbladder   Social History:  reports  that he quit smoking about 7 months ago. His smoking use included Cigarettes. He has a 12.5 pack-year smoking history. He has never used smokeless tobacco. He reports that he does not drink alcohol or use illicit drugs. Allergies  Allergen Reactions  . Codeine Nausea And Vomiting and Nausea Only   Prior to Admission medications   Medication Sig Start Date End Date Taking? Authorizing Provider  allopurinol (ZYLOPRIM) 100 MG tablet Take 100 mg by mouth daily.   Yes Historical Provider, MD  aspirin 81 MG chewable tablet Chew 81 mg by mouth every morning.   Yes Historical Provider, MD  calcium acetate (PHOSLO) 667 MG capsule Take 1,334-2,668 mg by mouth See admin instructions. Takes 3 capsules with meals and 2 capsules with snacks   Yes Historical Provider, MD  camphor-menthol (SARNA) lotion Apply 1 application topically 3 (three) times daily as needed for itching.   Yes Historical Provider, MD  capsicum (CAPSAICIN APR) 0.075 % topical cream Apply 1 application topically daily as needed (pain).   Yes Historical Provider, MD  carvedilol (COREG) 3.125 MG tablet Take 3.125 mg by mouth See admin instructions. Take twice daily on non HD days, Take once daily on HD days   Yes Historical Provider, MD  fosamprenavir (LEXIVA) 700 MG tablet Take 700 mg by mouth 2 (two) times daily with a meal. 09/05/12  Yes Eugenie Filler, MD  gabapentin (NEURONTIN) 300 MG capsule Take 300-600 mg by mouth 2 (two) times daily. 1 cap in am and 2 caps in pm   Yes Historical Provider, MD  insulin glargine (LANTUS) 100 UNIT/ML injection Inject 2-15 Units into the skin every morning. Per sliding scale   Yes Historical Provider, MD  insulin regular (NOVOLIN R,HUMULIN R) 100 units/mL injection Inject 2-14 Units into the skin 3 (three) times daily before meals. Pt takes anywhere from 6-14 units as directed per sliding scale   Yes Historical Provider, MD  latanoprost (XALATAN) 0.005 % ophthalmic solution Place 1 drop into both eyes at  bedtime.   Yes Historical Provider, MD  multivitamin (RENA-VIT) TABS tablet Take 1 tablet by mouth daily.   Yes Historical Provider, MD  nortriptyline (PAMELOR) 25 MG capsule Take 50 mg by mouth at bedtime.   Yes Historical Provider, MD  oxyCODONE (OXY IR/ROXICODONE) 5 MG immediate release tablet Take 10 mg by mouth every 4 (four) hours.   Yes Historical Provider, MD  pantoprazole (PROTONIX) 40 MG tablet Take 40 mg by mouth 2 (two) times daily.   Yes Historical Provider, MD  polyvinyl alcohol (LIQUIFILM TEARS) 1.4 % ophthalmic solution Place 1 drop into both eyes 4 (four) times daily.   Yes Historical Provider, MD  pravastatin (PRAVACHOL) 40 MG tablet Take 20 mg by mouth every evening.   Yes Historical Provider, MD  raltegravir (ISENTRESS) 400 MG tablet Take 400 mg by mouth 2 (two) times daily.   Yes Historical Provider, MD  ritonavir (NORVIR) 100 MG capsule Take 100 mg by mouth 2 (two) times daily.   Yes Historical Provider, MD  sildenafil (VIAGRA) 50 MG tablet Take 50 mg by mouth daily as needed. For erectile dysfunction   Yes Historical Provider, MD  simethicone (MYLICON) 80 MG chewable tablet Chew 240 mg by mouth 4 (four) times daily as needed for flatulence.   Yes Historical Provider, MD  tenofovir (VIREAD) 300 MG tablet Take 300 mg by mouth every 7 (seven) days. Take on Tuesdays after hemodialysis.   Yes Historical Provider, MD  urea (CARMOL) 20 % cream Apply 1 application topically 2 (two) times daily as needed (dry feet).   Yes Historical Provider, MD   Current Facility-Administered Medications  Medication Dose Route Frequency Provider Last Rate Last Dose  . acetaminophen (TYLENOL) tablet 650 mg  650 mg Oral Q6H PRN Berle Mull, MD       Or  . acetaminophen (TYLENOL) suppository 650 mg  650 mg Rectal Q6H PRN Berle Mull, MD      . allopurinol (ZYLOPRIM) tablet 100 mg  100 mg Oral Daily Berle Mull, MD   100 mg at 08/27/14 0942  . calcium acetate (PHOSLO) capsule 2,668 mg  2,668 mg Oral  TID WC Berle Mull, MD   2,668 mg at 08/28/14 0831  . calcium carbonate (OS-CAL - dosed in mg of elemental calcium) tablet 1,000 mg of elemental calcium  2 tablet Oral TID WC Berle Mull, MD   1,000 mg of elemental calcium at 08/28/14 0830  . camphor-menthol (SARNA) lotion 1 application  1 application Topical TID PRN Cherene Altes, MD      . carvedilol (COREG) tablet 3.125 mg  3.125 mg Oral Q supper Cherene Altes, MD   3.125 mg at 08/27/14 1734  . [START ON 08/29/2014] carvedilol (COREG) tablet 3.125  mg  3.125 mg Oral Once per day on Sun Mon Wed Fri Cherene Altes, MD      . doxercalciferol (HECTOROL) injection 1 mcg  1 mcg Intravenous Q T,Th,Sa-HD Myriam Jacobson, PA-C      . fosamprenavir (LEXIVA) tablet 700 mg  700 mg Oral BID WC Cherene Altes, MD   700 mg at 08/28/14 6767  . gabapentin (NEURONTIN) capsule 300 mg  300 mg Oral Daily Cherene Altes, MD      . gabapentin (NEURONTIN) tablet 600 mg  600 mg Oral QHS Cherene Altes, MD   600 mg at 08/27/14 2226  . HYDROcodone-acetaminophen (NORCO/VICODIN) 5-325 MG per tablet 1-2 tablet  1-2 tablet Oral Q6H PRN Berle Mull, MD   2 tablet at 08/27/14 0726  . insulin aspart (novoLOG) injection 0-15 Units  0-15 Units Subcutaneous TID WC Cherene Altes, MD   2 Units at 08/28/14 [phone]  . latanoprost (XALATAN) 0.005 % ophthalmic solution 1 drop  1 drop Both Eyes QHS Cherene Altes, MD   1 drop at 08/27/14 2227  . metoCLOPramide (REGLAN) tablet 5 mg  5 mg Oral Q8H PRN Berle Mull, MD      . multivitamin (RENA-VIT) tablet 1 tablet  1 tablet Oral QHS Cherene Altes, MD   1 tablet at 08/27/14 2224  . nortriptyline (PAMELOR) capsule 50 mg  50 mg Oral QHS Berle Mull, MD   50 mg at 08/27/14 2225  . ondansetron (ZOFRAN) tablet 4 mg  4 mg Oral Q6H PRN Berle Mull, MD       Or  . ondansetron (ZOFRAN) injection 4 mg  4 mg Intravenous Q6H PRN Berle Mull, MD      . oxyCODONE (Oxy IR/ROXICODONE) immediate release tablet 5-10 mg  5-10  mg Oral Q6H PRN Cherene Altes, MD   10 mg at 08/27/14 2005  . pantoprazole (PROTONIX) EC tablet 40 mg  40 mg Oral BID Cherene Altes, MD   40 mg at 08/27/14 2226  . polyvinyl alcohol (LIQUIFILM TEARS) 1.4 % ophthalmic solution 1 drop  1 drop Both Eyes QID Cherene Altes, MD   1 drop at 08/27/14 2228  . pravastatin (PRAVACHOL) tablet 20 mg  20 mg Oral QPM Berle Mull, MD   20 mg at 08/27/14 1735  . raltegravir (ISENTRESS) tablet 400 mg  400 mg Oral BID Berle Mull, MD   400 mg at 08/27/14 2226  . ritonavir (NORVIR) capsule 100 mg  100 mg Oral BID WC Berle Mull, MD   100 mg at 08/27/14 1735  . simethicone (MYLICON) chewable tablet 240 mg  240 mg Oral QID PRN Cherene Altes, MD      . sodium chloride 0.9 % injection 3 mL  3 mL Intravenous Q12H Berle Mull, MD   3 mL at 08/27/14 2228  . [START ON 09/02/2014] tenofovir (VIREAD) tablet 300 mg  300 mg Oral Q7 days Berle Mull, MD       Labs: Basic Metabolic Panel:  Recent Labs Lab 08/26/14 1736 08/27/14 0245 08/28/14 0546  NA 140 139 138  K 3.5* 4.2 4.9  CL 96 100 98  CO2 31 31 28   GLUCOSE 217* 135* 137*  BUN 5* 8 15  CREATININE 3.25* 3.92* 5.94*  CALCIUM 8.6 8.1* 9.2  PHOS  --   --  3.6   Liver Function Tests:  Recent Labs Lab 08/26/14 1736 08/27/14 0245 08/28/14 0546  AST 15 13  --  ALT 7 6  --   ALKPHOS 119* 106  --   BILITOT 0.2* 0.4  --   PROT 6.9 6.2  --   ALBUMIN 2.7* 2.3* 2.4*  CBC:  Recent Labs Lab 08/26/14 1736 08/27/14 0245 08/27/14 1542 08/28/14 0546  WBC 4.3 3.5* 4.5 4.5  NEUTROABS 2.7  --   --   --   HGB 7.0* 6.8* 9.1* 9.2*  HCT 23.5* 22.4* 29.4* 29.7*  MCV 99.2 94.1 94.2 92.8  PLT 102* 84* 87* 103*  CBG:  Recent Labs Lab 08/27/14 0621 08/27/14 1132 08/27/14 1636 08/27/14 2103 08/28/14 0758  GLUCAP 104* 147* 172* 173* 123*   Iron Studies: No results found for this basename: IRON, TIBC, TRANSFERRIN, FERRITIN,  in the last 72 hours Studies/Results: Dg Chest Portable 1  View  08/26/2014   CLINICAL DATA:  Hypertension; rectal carcinoma  EXAM: PORTABLE CHEST - 1 VIEW  COMPARISON:  May 11, 2013  FINDINGS: There is no edema or consolidation. Heart is upper normal in size with pulmonary vascularity within normal limits. No adenopathy. No bone lesions. There is atherosclerotic change in the aortic arch region.  IMPRESSION: No edema or consolidation.   Electronically Signed   By: Lowella Grip M.D.   On: 08/26/2014 18:25    ROS: As per HPI otherwise negative.  Physical Exam: Filed Vitals:   08/27/14 1220 08/27/14 1800 08/27/14 2105 08/28/14 0446  BP: 124/75 129/63 125/64 145/61  Pulse: 84 82 76 83  Temp: 97.8 F (36.6 C) 98.2 F (36.8 C) 98.5 F (36.9 C) 98.6 F (37 C)  TempSrc: Oral Oral Oral Oral  Resp: 18 20 17 16   Height:      Weight:   94.257 kg (207 lb 12.8 oz)   SpO2: 100% 98% 100% 94%     General: Well developed, well nourished, in no acute distress - up walking in the hall with cane Head: Normocephalic, atraumatic, sclera non-icteric, mucus membranes are moist Neck: Supple. JVD not elevated. Lungs: Clear bilaterally to auscultation without wheezes, rales, or rhonchi. Breathing is unlabored. Heart: RRR with S1 S2. No murmurs, rubs, or gallops appreciated. Abdomen: Soft, non-tender, non-distended with normoactive bowel sounds. No rebound/guarding.  M-S:  Strength and tone appear normal for age. Lower extremities:without edema left toe amp site well healed Neuro: Alert and oriented X 3. Moves all extremities spontaneously. Psych:  Responds to questions appropriately with a normal affect. Dialysis Access: left upper AVF + bruit  Dialysis Orders: Center: [facility] TTS 4 hr 180 400/800 2K 2.25 Ca EDW 91.5 heparin 4000 Hectorol 1 Aranesp 200 Venofer 50 per week  Assessment/Plan: 1. Recurrent GIB with hx AVMs, protal gastropathy and stomach ulcer in the setting of cirrhosis - plan stop heparin with HD - transfuse prn -- this is a longstanding  problem 2. ESRD -  TTS - HD today 3. Hypertension/volume  - controlled - on low dose BB 4. Anemia with recurrent GIB - keep on max ESA - need to recheck Fe studies - transfuse prn 5. Metabolic bone disease -  Continue Hectorol and 3 phoslo ac and 3 renvela ac 6. Nutrition - renal diet 7. DM - per primary 8. Hep B +/HIV + - requires isolation HD for Hep B 9. Osteo left foot - continue Vanc and Fortaz - finish course as outpt 10.  Disp - ok for d/c from renal standpoint after HD -   Myriam Jacobson, PA-C [place] [phone] 08/28/2014, 8:55 AM

## 2014-08-28 NOTE — Discharge Summary (Signed)
PATIENT DETAILS Name: Benjamin Long Age: 68 y.o. Sex: male Date of Birth: Sep 17, 1946 MRN: [id number]. Admitting Physician: Berle Mull, MD DIY:[id number],[id number] K, MD  Admit Date: 08/26/2014 Discharge date: 08/28/2014  Recommendations for Outpatient Follow-up:  1. History of chronic GI bleeding secondary to AVM-please monitor CBC very closely. 2. Continue vancomycin/Fortaz during hemodialysis for osteomyelitis and complete treatment  PRIMARY DISCHARGE DIAGNOSIS:  Principal Problem:   Symptomatic anemia Active Problems:   HIV disease   Hypertension   DM type 2 causing renal disease   Iron (Fe) deficiency anemia   Hepatitis   ESRD (end stage renal disease)   GI bleed      PAST MEDICAL HISTORY: Past Medical History  Diagnosis Date  . ESRD on peritoneal dialysis   . HIV disease   . Hypertension   . Shortness of breath   . DM type 2 causing renal disease   . Neuromuscular disorder     tremers  . Cancer     hx of rectal cancer  . Anemia 09/02/2012  . Hepatitis     hx of hepatitis B  . Iron (Fe) deficiency anemia 09/02/2012  . Peripheral vascular disease   . Osteomyelitis     DISCHARGE MEDICATIONS: Current Discharge Medication List    CONTINUE these medications which have NOT CHANGED   Details  allopurinol (ZYLOPRIM) 100 MG tablet Take 100 mg by mouth daily.    calcium acetate (PHOSLO) 667 MG capsule Take 1,334-2,668 mg by mouth See admin instructions. Takes 3 capsules with meals and 2 capsules with snacks    camphor-menthol (SARNA) lotion Apply 1 application topically 3 (three) times daily as needed for itching.    capsicum (CAPSAICIN APR) 0.075 % topical cream Apply 1 application topically daily as needed (pain).    carvedilol (COREG) 3.125 MG tablet Take 3.125 mg by mouth See admin instructions. Take twice daily on non HD days, Take once daily on HD days    fosamprenavir (LEXIVA) 700 MG tablet Take 700 mg by mouth 2 (two) times daily with a meal.      gabapentin (NEURONTIN) 300 MG capsule Take 300-600 mg by mouth 2 (two) times daily. 1 cap in am and 2 caps in pm    insulin glargine (LANTUS) 100 UNIT/ML injection Inject 2-15 Units into the skin every morning. Per sliding scale    insulin regular (NOVOLIN R,HUMULIN R) 100 units/mL injection Inject 2-14 Units into the skin 3 (three) times daily before meals. Pt takes anywhere from 6-14 units as directed per sliding scale    latanoprost (XALATAN) 0.005 % ophthalmic solution Place 1 drop into both eyes at bedtime.    nortriptyline (PAMELOR) 25 MG capsule Take 50 mg by mouth at bedtime.    oxyCODONE (OXY IR/ROXICODONE) 5 MG immediate release tablet Take 10 mg by mouth every 4 (four) hours.    pantoprazole (PROTONIX) 40 MG tablet Take 40 mg by mouth 2 (two) times daily.    polyvinyl alcohol (LIQUIFILM TEARS) 1.4 % ophthalmic solution Place 1 drop into both eyes 4 (four) times daily.    pravastatin (PRAVACHOL) 40 MG tablet Take 20 mg by mouth every evening.    raltegravir (ISENTRESS) 400 MG tablet Take 400 mg by mouth 2 (two) times daily.    ritonavir (NORVIR) 100 MG capsule Take 100 mg by mouth 2 (two) times daily.    sildenafil (VIAGRA) 50 MG tablet Take 50 mg by mouth daily as needed. For erectile dysfunction    simethicone (MYLICON) 80 MG chewable  tablet Chew 240 mg by mouth 4 (four) times daily as needed for flatulence.    tenofovir (VIREAD) 300 MG tablet Take 300 mg by mouth every 7 (seven) days. Take on Tuesdays after hemodialysis.    urea (CARMOL) 20 % cream Apply 1 application topically 2 (two) times daily as needed (dry feet).      STOP taking these medications     aspirin 81 MG chewable tablet      multivitamin (RENA-VIT) TABS tablet      calcium carbonate (OS-CAL - DOSED IN MG OF ELEMENTAL CALCIUM) 1250 MG tablet      HYDROcodone-acetaminophen (NORCO/VICODIN) 5-325 MG per tablet      metoCLOPramide (REGLAN) 5 MG tablet         ALLERGIES:   Allergies  Allergen  Reactions  . Codeine Nausea And Vomiting and Nausea Only    BRIEF HPI:  See H&P, Labs, Consult and Test reports for all details in brief, patient is a 68 year old male with a history of chronic GI bleed econdary to gastric AVMs, end-stage disease, HIV,  was admitted for symptomatic anemia.  CONSULTATIONS:   nephrology  PERTINENT RADIOLOGIC STUDIES: Dg Chest Portable 1 View  08/26/2014   CLINICAL DATA:  Hypertension; rectal carcinoma  EXAM: PORTABLE CHEST - 1 VIEW  COMPARISON:  May 11, 2013  FINDINGS: There is no edema or consolidation. Heart is upper normal in size with pulmonary vascularity within normal limits. No adenopathy. No bone lesions. There is atherosclerotic change in the aortic arch region.  IMPRESSION: No edema or consolidation.   Electronically Signed   By: Lowella Grip M.D.   On: 08/26/2014 18:25     PERTINENT LAB RESULTS: CBC:  Recent Labs  08/27/14 1542 08/28/14 0546  WBC 4.5 4.5  HGB 9.1* 9.2*  HCT 29.4* 29.7*  PLT 87* 103*   CMET CMP     Component Value Date/Time   NA 138 08/28/2014 0546   K 4.9 08/28/2014 0546   CL 98 08/28/2014 0546   CO2 28 08/28/2014 0546   GLUCOSE 137* 08/28/2014 0546   BUN 15 08/28/2014 0546   CREATININE 5.94* 08/28/2014 0546   CALCIUM 9.2 08/28/2014 0546   PROT 6.2 08/27/2014 0245   ALBUMIN 2.4* 08/28/2014 0546   AST 13 08/27/2014 0245   ALT 6 08/27/2014 0245   ALKPHOS 106 08/27/2014 0245   BILITOT 0.4 08/27/2014 0245   GFRNONAA 9* 08/28/2014 0546   GFRAA 10* 08/28/2014 0546    GFR Estimated Creatinine Clearance: 13.8 ml/min (by C-G formula based on Cr of 5.94). No results found for this basename: LIPASE, AMYLASE,  in the last 72 hours No results found for this basename: CKTOTAL, CKMB, CKMBINDEX, TROPONINI,  in the last 72 hours No components found with this basename: POCBNP,  No results found for this basename: DDIMER,  in the last 72 hours No results found for this basename: HGBA1C,  in the last 72 hours No  results found for this basename: CHOL, HDL, LDLCALC, TRIG, CHOLHDL, LDLDIRECT,  in the last 72 hours No results found for this basename: TSH, T4TOTAL, FREET3, T3FREE, THYROIDAB,  in the last 72 hours  Recent Labs  08/28/14 0546  RETICCTPCT 4.9*   Coags:  Recent Labs  08/27/14 0245  INR 1.16   Microbiology: Recent Results (from the past 240 hour(s))  MRSA PCR SCREENING     Status: None   Collection Time    08/27/14  6:30 AM      Result Value Ref Range  Status   MRSA by PCR NEGATIVE  NEGATIVE Final   Comment:            The GeneXpert MRSA Assay (FDA     approved for NASAL specimens     only), is one component of a     comprehensive MRSA colonization     surveillance program. It is not     intended to diagnose MRSA     infection nor to guide or     monitor treatment for     MRSA infections.     BRIEF HOSPITAL COURSE:   Principal Problem: Acute on chronic symptomatic anemia due to chronic blood loss and CKD: Patient has a known history of liver cirrhosis with portal gastropathy, history of gastric AVMs and chronic GI bleed. He follows up with gastroenterology at the [facility] in [place]. He presented to the ED this admission with a hemoglobin of 6.8. He was subsequently admitted and transfused 2 units of PRBC. Hemoglobin posttransfusion remains stable at 9.2. There is no history of any overt bleeding, claims stools are brown in color, no history of hematochezia. Since this is a chronic issue, and there was no overt blood loss gastroenterology was not consulted. Patient gets most of his care at the [place] system, he is requesting discharge today post dialysis. Since hemoglobin remains stable, patient is stable for discharge with close follow-up with gastroenterology as an outpatient. Per nephrology, patient will get periodic/close CBC monitoring as outpatient  Active Problems: Thrombocytopenia: Seems to be a chronic issue, further workup deferred to the outpatient setting.    HIV  disease: Continue with antiretrovirals    Hypertension: Stable, continue Coreg    DM type 2 causing renal disease: Stable, continue with prior insulin regimen   History of recent osteomyelitis of left great toe stump: Was getting vancomycin and Fortaz prior to this admit, nephrology will continue during dialysis    ESRD (end stage renal disease): Will continue with outpatient hemodialysis at his usual schedule.  TODAY-DAY OF DISCHARGE:  Subjective:   Benjamin Long today has no headache,no chest abdominal pain,no new weakness tingling or numbness, feels much better wants to go home today  Objective:   Blood pressure 126/66, pulse 75, temperature 98 F (36.7 C), temperature source Oral, resp. rate 16, height 5\' 11"  (1.803 m), weight 94.6 kg (208 lb 8.9 oz), SpO2 97.00%.  Intake/Output Summary (Last 24 hours) at 08/28/14 1358 Last data filed at 08/28/14 0900  Gross per 24 hour  Intake    600 ml  Output      0 ml  Net    600 ml   Filed Weights   08/27/14 0334 08/27/14 2105 08/28/14 1211  Weight: 94 kg (207 lb 3.7 oz) 94.257 kg (207 lb 12.8 oz) 94.6 kg (208 lb 8.9 oz)    Exam Awake Alert, Oriented *3, No new F.N deficits, Normal affect [place].AT,PERRAL Supple Neck,No JVD, No cervical lymphadenopathy appriciated.  Symmetrical Chest wall movement, Good air movement bilaterally, CTAB RRR,No Gallops,Rubs or new Murmurs, No Parasternal Heave +ve B.Sounds, Abd Soft, Non tender, No organomegaly appriciated, No rebound -guarding or rigidity. No Cyanosis, Clubbing or edema, No new Rash or bruise  DISCHARGE CONDITION: Stable  DISPOSITION: Home  DISCHARGE INSTRUCTIONS:    Activity:  As tolerated   Diet recommendation: Diabetic Diet Heart Healthy diet  Discharge Instructions   Call MD for:  extreme fatigue    Complete by:  As directed      Diet - low sodium  heart healthy    Complete by:  As directed      Diet Carb Modified    Complete by:  As directed      Increase  activity slowly    Complete by:  As directed            Follow-up Information   Follow up with [facility] K, MD. Schedule an appointment as soon as possible for a visit in 1 week.   Specialty:  Family Medicine   Contact information:   [address] [place] [address] [phone]       Please follow up. (keep usual schedule)    Contact information:   hemodialysis center      Schedule an appointment as soon as possible for a visit in 2 weeks to follow up.   Contact information:   [facility] MD at [place]      Total Time spent on discharge equals 45 minutes.  SignedOren Binet 08/28/2014 1:58 PM

## 2014-08-28 NOTE — Consult Note (Signed)
I saw the patient and agree with the above assessment and plan.    Patient admitted with worsening anemia. Has history of chronic GI loss from portal hypertensive gastropathy related to cirrhosis. Follows with [facility] and the [facility] with gastroenterology.  Has been recently transfused here in the hospital and hemoglobin is stable. He is not symptomatic.  We'll continue dialysis today on his outpatient schedule. He will have hemoglobin checked on a weekly basis. He continues on maintenance iron and full dose Aranesp. We'll stop heparin with his dialysis treatments. Should be okay for discharge after dialysis.

## 2014-10-21 ENCOUNTER — Emergency Department (HOSPITAL_COMMUNITY)
Admission: EM | Admit: 2014-10-21 | Discharge: 2014-10-21 | Disposition: A | Discharge status: Home / Self Care | Payer: Medicare PPO | Attending: Emergency Medicine | Admitting: Emergency Medicine

## 2014-10-21 ENCOUNTER — Encounter (HOSPITAL_COMMUNITY): Payer: Self-pay | Admitting: *Deleted

## 2014-10-21 DIAGNOSIS — Z992 Dependence on renal dialysis: Secondary | ICD-10-CM | POA: Insufficient documentation

## 2014-10-21 DIAGNOSIS — Z21 Asymptomatic human immunodeficiency virus [HIV] infection status: Secondary | ICD-10-CM | POA: Insufficient documentation

## 2014-10-21 DIAGNOSIS — E1122 Type 2 diabetes mellitus with diabetic chronic kidney disease: Secondary | ICD-10-CM | POA: Insufficient documentation

## 2014-10-21 DIAGNOSIS — N186 End stage renal disease: Secondary | ICD-10-CM | POA: Insufficient documentation

## 2014-10-21 DIAGNOSIS — E875 Hyperkalemia: Secondary | ICD-10-CM | POA: Insufficient documentation

## 2014-10-21 DIAGNOSIS — I959 Hypotension, unspecified: Secondary | ICD-10-CM

## 2014-10-21 DIAGNOSIS — Z794 Long term (current) use of insulin: Secondary | ICD-10-CM | POA: Insufficient documentation

## 2014-10-21 DIAGNOSIS — I120 Hypertensive chronic kidney disease with stage 5 chronic kidney disease or end stage renal disease: Secondary | ICD-10-CM | POA: Insufficient documentation

## 2014-10-21 DIAGNOSIS — M869 Osteomyelitis, unspecified: Secondary | ICD-10-CM | POA: Insufficient documentation

## 2014-10-21 DIAGNOSIS — Z87891 Personal history of nicotine dependence: Secondary | ICD-10-CM | POA: Insufficient documentation

## 2014-10-21 DIAGNOSIS — Z8619 Personal history of other infectious and parasitic diseases: Secondary | ICD-10-CM | POA: Insufficient documentation

## 2014-10-21 DIAGNOSIS — Z79899 Other long term (current) drug therapy: Secondary | ICD-10-CM | POA: Insufficient documentation

## 2014-10-21 DIAGNOSIS — I9589 Other hypotension: Secondary | ICD-10-CM | POA: Insufficient documentation

## 2014-10-21 DIAGNOSIS — Z85048 Personal history of other malignant neoplasm of rectum, rectosigmoid junction, and anus: Secondary | ICD-10-CM | POA: Insufficient documentation

## 2014-10-21 DIAGNOSIS — D649 Anemia, unspecified: Secondary | ICD-10-CM

## 2014-10-21 LAB — CBC WITH DIFFERENTIAL/PLATELET
Basophils Absolute: 0 10*3/uL (ref 0.0–0.1)
Basophils Relative: 1 % (ref 0–1)
EOS PCT: 5 % (ref 0–5)
Eosinophils Absolute: 0.2 10*3/uL (ref 0.0–0.7)
HEMATOCRIT: 24.4 % — AB (ref 39.0–52.0)
HEMOGLOBIN: 7.2 g/dL — AB (ref 13.0–17.0)
Lymphocytes Relative: 31 % (ref 12–46)
Lymphs Abs: 1.4 10*3/uL (ref 0.7–4.0)
MCH: 28.6 pg (ref 26.0–34.0)
MCHC: 29.5 g/dL — ABNORMAL LOW (ref 30.0–36.0)
MCV: 96.8 fL (ref 78.0–100.0)
MONO ABS: 0.4 10*3/uL (ref 0.1–1.0)
MONOS PCT: 8 % (ref 3–12)
Neutro Abs: 2.4 10*3/uL (ref 1.7–7.7)
Neutrophils Relative %: 55 % (ref 43–77)
Platelets: 77 10*3/uL — ABNORMAL LOW (ref 150–400)
RBC: 2.52 MIL/uL — ABNORMAL LOW (ref 4.22–5.81)
RDW: 18 % — AB (ref 11.5–15.5)
WBC: 4.3 10*3/uL (ref 4.0–10.5)

## 2014-10-21 LAB — BASIC METABOLIC PANEL
Anion gap: 16 — ABNORMAL HIGH (ref 5–15)
BUN: 57 mg/dL — AB (ref 6–23)
CALCIUM: 8.3 mg/dL — AB (ref 8.4–10.5)
CO2: 22 mmol/L (ref 19–32)
Chloride: 95 mEq/L — ABNORMAL LOW (ref 96–112)
Creatinine, Ser: 11.18 mg/dL — ABNORMAL HIGH (ref 0.50–1.35)
GFR calc Af Amer: 5 mL/min — ABNORMAL LOW (ref >90)
GFR calc non Af Amer: 4 mL/min — ABNORMAL LOW (ref >90)
GLUCOSE: 299 mg/dL — AB (ref 70–99)
Potassium: 5.8 mmol/L — ABNORMAL HIGH (ref 3.5–5.1)
Sodium: 133 mmol/L — ABNORMAL LOW (ref 135–145)

## 2014-10-21 LAB — POC OCCULT BLOOD, ED: Fecal Occult Bld: POSITIVE — AB

## 2014-10-21 LAB — PREPARE RBC (CROSSMATCH)

## 2014-10-21 MED ORDER — SODIUM CHLORIDE 0.9 % IV SOLN
10.0000 mL/h | Freq: Once | INTRAVENOUS | Status: AC
  Administered 2014-10-21: 10 mL/h via INTRAVENOUS

## 2014-10-21 MED ORDER — SODIUM POLYSTYRENE SULFONATE PO POWD: Freq: Once | ORAL | Status: DC

## 2014-10-21 MED ORDER — SODIUM CHLORIDE 0.9 % IV BOLUS (SEPSIS)
500.0000 mL | Freq: Once | INTRAVENOUS | Status: AC
Start: 2014-10-21 — End: 2014-10-21
  Administered 2014-10-21: 500 mL via INTRAVENOUS

## 2014-10-21 MED ORDER — FENTANYL CITRATE 0.05 MG/ML IJ SOLN
50.0000 ug | Freq: Once | INTRAMUSCULAR | Status: AC
  Administered 2014-10-21: 50 ug via INTRAVENOUS
  Filled 2014-10-21: qty 2

## 2014-10-21 MED ORDER — SODIUM POLYSTYRENE SULFONATE 15 GM/60ML PO SUSP
50.0000 g | Freq: Once | ORAL | Status: AC
Start: 2014-10-21 — End: 2014-10-21
  Administered 2014-10-21: 50 g via ORAL
  Filled 2014-10-21: qty 240

## 2014-10-21 NOTE — ED Notes (Signed)
Blood product completed at 1736

## 2014-10-21 NOTE — ED Provider Notes (Signed)
CSN: [id number]     Arrival date & time 10/21/14  1058 History   First MD Initiated Contact with Patient 10/21/14 1141     Chief Complaint  Patient presents with  . Hypotension     (Consider location/radiation/quality/duration/timing/severity/associated sxs/prior Treatment) HPI Comments: Patient sent from dialysis with report of hypotension.  He reports that his blood pressures were low during dialysis.  This is not unusual for him.  He had associated lightheadedness.  He also states that there was concerned that his fistula was clotted.  He is also had recent anemia of 7.4 and states that he is to have his hemoglobin rechecked.  He denies chest pain, trouble breathing, abdominal pain, nausea, nausea, vomiting or diarrhea.  He states that his hemoglobin has been low due to GI bleeding which is thought to be due to bleeding diverticula.  He had a colonoscopy approximately 2 weeks ago for the same at the [facility].      Past Medical History  Diagnosis Date  . ESRD on peritoneal dialysis   . HIV disease   . Hypertension   . Shortness of breath   . DM type 2 causing renal disease   . Neuromuscular disorder     tremers  . Cancer     hx of rectal cancer  . Anemia 09/02/2012  . Hepatitis     hx of hepatitis B  . Iron (Fe) deficiency anemia 09/02/2012  . Peripheral vascular disease   . Osteomyelitis    Past Surgical History  Procedure Laterality Date  . Hemorroidectomy    . Insertion of dialysis catheter    . Hernia repair      TESTICLE  . Amputation of big toe Left april 2015   Family History  Problem Relation Age of Onset  . Diabetes Mother   . Cancer Mother     gallbladder  . Heart disease Father   . Cancer Sister     gallbladder   History  Substance Use Topics  . Smoking status: Former Smoker -- 0.25 packs/day for 50 years    Types: Cigarettes    Quit date: 01/08/2014  . Smokeless tobacco: Never Used  . Alcohol Use: No    Review of Systems   Constitutional: Negative for fever, activity change, appetite change and fatigue.  HENT: Negative for congestion, facial swelling, rhinorrhea and trouble swallowing.   Eyes: Negative for photophobia and pain.  Respiratory: Negative for cough, chest tightness and shortness of breath.   Cardiovascular: Negative for chest pain and leg swelling.  Gastrointestinal: Negative for nausea, vomiting, abdominal pain, diarrhea and constipation.  Endocrine: Negative for polydipsia and polyuria.  Genitourinary: Negative for dysuria, urgency, decreased urine volume and difficulty urinating.  Musculoskeletal: Negative for back pain and gait problem.  Skin: Negative for color change, rash and wound.  Allergic/Immunologic: Negative for immunocompromised state.  Neurological: Positive for light-headedness. Negative for dizziness, facial asymmetry, speech difficulty, weakness, numbness and headaches.  Psychiatric/Behavioral: Negative for confusion, decreased concentration and agitation.      Allergies  Codeine  Home Medications   Prior to Admission medications   Medication Sig Start Date End Date Taking? Authorizing Provider  allopurinol (ZYLOPRIM) 100 MG tablet Take 100 mg by mouth daily.   Yes Historical Provider, MD  calcium acetate (PHOSLO) 667 MG capsule Take 1,334-2,668 mg by mouth See admin instructions. Takes 3 capsules with meals and 2 capsules with snacks   Yes Historical Provider, MD  camphor-menthol (SARNA) lotion Apply 1 application topically 3 (  three) times daily as needed for itching.   Yes Historical Provider, MD  capsicum (CAPSAICIN APR) 0.075 % topical cream Apply 1 application topically daily as needed (pain).   Yes Historical Provider, MD  carvedilol (COREG) 3.125 MG tablet Take 3.125 mg by mouth See admin instructions. Take twice daily on non HD days, Take once daily on HD days   Yes Historical Provider, MD  fosamprenavir (LEXIVA) 700 MG tablet Take 700 mg by mouth 2 (two) times daily  with a meal. 09/05/12  Yes Eugenie Filler, MD  gabapentin (NEURONTIN) 300 MG capsule Take 300-600 mg by mouth 2 (two) times daily. 1 cap in am and 2 caps in pm   Yes Historical Provider, MD  insulin glargine (LANTUS) 100 UNIT/ML injection Inject 2-15 Units into the skin every morning. Per sliding scale   Yes Historical Provider, MD  insulin regular (NOVOLIN R,HUMULIN R) 100 units/mL injection Inject 2-14 Units into the skin 3 (three) times daily before meals. Pt takes anywhere from 6-14 units as directed per sliding scale   Yes Historical Provider, MD  latanoprost (XALATAN) 0.005 % ophthalmic solution Place 1 drop into both eyes at bedtime.   Yes Historical Provider, MD  nortriptyline (PAMELOR) 25 MG capsule Take 50 mg by mouth at bedtime.   Yes Historical Provider, MD  oxyCODONE (OXY IR/ROXICODONE) 5 MG immediate release tablet Take 10 mg by mouth every 4 (four) hours.   Yes Historical Provider, MD  pantoprazole (PROTONIX) 40 MG tablet Take 40 mg by mouth 2 (two) times daily.   Yes Historical Provider, MD  polyvinyl alcohol (LIQUIFILM TEARS) 1.4 % ophthalmic solution Place 1 drop into both eyes 4 (four) times daily.   Yes Historical Provider, MD  pravastatin (PRAVACHOL) 40 MG tablet Take 20 mg by mouth every evening.   Yes Historical Provider, MD  raltegravir (ISENTRESS) 400 MG tablet Take 400 mg by mouth 2 (two) times daily.   Yes Historical Provider, MD  ritonavir (NORVIR) 100 MG capsule Take 100 mg by mouth 2 (two) times daily.   Yes Historical Provider, MD  sildenafil (VIAGRA) 50 MG tablet Take 50 mg by mouth daily as needed. For erectile dysfunction   Yes Historical Provider, MD  simethicone (MYLICON) 80 MG chewable tablet Chew 240 mg by mouth 4 (four) times daily as needed for flatulence.   Yes Historical Provider, MD  tenofovir (VIREAD) 300 MG tablet Take 300 mg by mouth every 7 (seven) days. Take on Tuesdays after hemodialysis.   Yes Historical Provider, MD  urea (CARMOL) 20 % cream Apply 1  application topically 2 (two) times daily as needed (dry feet).   Yes Historical Provider, MD  sodium polystyrene (KAYEXALATE) powder Take by mouth once. Take 50g by mouth tomorrow. 10/21/14   Ernestina Patches, MD   BP 105/62 mmHg  Pulse 68  Temp(Src) 97.5 F (36.4 C) (Oral)  Resp 13  SpO2 98% Physical Exam  Constitutional: He is oriented to person, place, and time. He appears well-developed and well-nourished. No distress.  HENT:  Head: Normocephalic and atraumatic.  Mouth/Throat: No oropharyngeal exudate.  Eyes: Pupils are equal, round, and reactive to light.  Neck: Normal range of motion. Neck supple.  Cardiovascular: Normal rate, regular rhythm and normal heart sounds.  Exam reveals no gallop and no friction rub.   No murmur heard. Pulmonary/Chest: Effort normal and breath sounds normal. No respiratory distress. He has no wheezes. He has no rales.  Abdominal: Soft. Bowel sounds are normal. He exhibits no distension  and no mass. There is no tenderness. There is no rebound and no guarding.  Musculoskeletal: Normal range of motion. He exhibits no edema or tenderness.  Neurological: He is alert and oriented to person, place, and time.  Skin: Skin is warm and dry.  Psychiatric: He has a normal mood and affect.    ED Course  Procedures (including critical care time) Labs Review Labs Reviewed  CBC WITH DIFFERENTIAL - Abnormal; Notable for the following:    RBC 2.52 (*)    Hemoglobin 7.2 (*)    HCT 24.4 (*)    MCHC 29.5 (*)    RDW 18.0 (*)    Platelets 77 (*)    All other components within normal limits  BASIC METABOLIC PANEL - Abnormal; Notable for the following:    Sodium 133 (*)    Potassium 5.8 (*)    Chloride 95 (*)    Glucose, Bld 299 (*)    BUN 57 (*)    Creatinine, Ser 11.18 (*)    Calcium 8.3 (*)    GFR calc non Af Amer 4 (*)    GFR calc Af Amer 5 (*)    Anion gap 16 (*)    All other components within normal limits  POC OCCULT BLOOD, ED - Abnormal; Notable for the  following:    Fecal Occult Bld POSITIVE (*)    All other components within normal limits  TYPE AND SCREEN  PREPARE RBC (CROSSMATCH)    Imaging Review No results found.   EKG Interpretation   Date/Time:  Thursday October 21 2014 14:19:57 EST Ventricular Rate:  67 PR Interval:  273 QRS Duration: 178 QT Interval:  481 QTC Calculation: 508 R Axis:   -57 Text Interpretation:  Sinus rhythm Prolonged PR interval RBBB and LAFB No  significant change was found Confirmed by DOCHERTY  MD, MEGAN ([id number]) on  10/21/2014 3:14:19 PM      MDM   Final diagnoses:  Hyperkalemia  Symptomatic anemia  Transient hypotension    Pt is a 68 y.o. male with Pmhx as above who presents with hypotension, concern for AV fistula clot and recent anemia.  Per records, hemoglobin was 7.4.  Dialysis 2 days ago.  Patient states that he is had some lightheadedness while dialysis was going on today and had low blood pressures.  He states that it is not infrequent for him to get low blood pressures and lightheadedness during dialysis.  He states that he has had an ongoing issue with anemia due to bleeding diverticula.  He states that he had a colonoscopy about 2 weeks ago at the [facility] for similar symptoms, and that there is always blood in his stool, but he never sees it.  On physical exam, initial blood pressure here is 117/44.  Abdominal exam is benign.  Cardiopulmonary exam is benign.  He denies recent infectious signs or symptoms.  He denies arm pain.  He has a palpable thrill over the fistula.  Rectal exam with brown, weakly positive heme stool.  IV fluid bolus given with normalization of blood pressure.  Hemoglobin here is 7.2       I spoke with the nephrology, PA, who came down to assess the patient.  She states she does not feel that the AV fistula is fully clotted, as there is some palpable thrill area.  She states the patient needs a fistulogram but, though it can be ordered today will not be done  until after the Christmas holiday.  She states  that we have the option of discharge today and return visit for fistulogram in the next several days versus inpatient admission.  I initially was planning for inpatient admission given his several low blood pressure readings, hemoglobin of 7.2 with a weakly heme positive stool.  However, with discussing this with the patient.  He does not want to be in the hospital for the Christmas holiday.  I spoke with Dr. Prince Rome with nephrology about this.  We are planning to transfuse him 1 unit in the ED.  He will be given 50 g of Kayexalate here and a prescription for 50 g of Kayexalate to take at home tomorrow given his K is 5.8.  Of note, there are no acute EKG changes from prior.    Ernestina Patches, MD 10/21/14 [phone]

## 2014-10-21 NOTE — ED Provider Notes (Signed)
Pt seen by Dr Tawnya Crook.  Found to be anemic.  Guaic positive stools, brown stool but had a colonoscopy two weeks ago.  Has outpatient fistulogram scheduled.  Pt does not want to be admitted to the hospital.  Plan is for blood transfusion, outpatient follow up.  Strict precautions to return if he feels worse.  Dorie Rank, MD 10/21/14 [phone]

## 2014-10-21 NOTE — Discharge Instructions (Signed)
Hyperkalemia Hyperkalemia is when you have too much potassium in your blood. This can be a life-threatening condition. Potassium is normally removed (excreted) from the body by the kidneys. CAUSES  The potassium level in your body can become too high for the following reasons:  You take in too much potassium. You can do this by:  Using salt substitutes. They contain large amounts of potassium.  Taking potassium supplements from your caregiver. The dose may be too high for you.  Eating foods or taking nutritional products with potassium.  You excrete too little potassium. This can happen if:  Your kidneys are not functioning properly. Kidney (renal) disease is a very common cause of hyperkalemia.  You are taking medicines that lower your excretion of potassium, such as certain diuretic medicines.  You have an adrenal gland disease called Addison's disease.  You have a urinary tract obstruction, such as kidney stones.  You are on treatment to mechanically clean your blood (dialysis) and you skip a treatment.  You release a high amount of potassium from your cells into your blood. You may have a condition that causes potassium to move from your cells to your bloodstream. This can happen with:  Injury to muscles or other tissues. Most potassium is stored in the muscles.  Severe burns or infections.  Acidic blood plasma (acidosis). Acidosis can result from many diseases, such as uncontrolled diabetes. SYMPTOMS  Usually, there are no symptoms unless the potassium is dangerously high or has risen very quickly. Symptoms may include:  Irregular or very slow heartbeat.  Feeling sick to your stomach (nauseous).  Tiredness (fatigue).  Nerve problems such as tingling of the skin, numbness of the hands or feet, weakness, or paralysis. DIAGNOSIS  A simple blood test can measure the amount of potassium in your body. An electrocardiogram test of the heart can also help make the diagnosis.  The heart may beat dangerously fast or slow down and stop beating with severe hyperkalemia.  TREATMENT  Treatment depends on how bad the condition is and on the underlying cause.  If the hyperkalemia is an emergency (causing heart problems or paralysis), many different medicines can be used alone or together to lower the potassium level briefly. This may include an insulin injection even if you are not diabetic. Emergency dialysis may be needed to remove potassium from the body.  If the hyperkalemia is less severe or dangerous, the underlying cause is treated. This can include taking medicines if needed. Your prescription medicines may be changed. You may also need to take a medicine to help your body get rid of potassium. You may need to eat a diet low in potassium. HOME CARE INSTRUCTIONS   Take medicines and supplements as directed by your caregiver.  Do not take any over-the-counter medicines, supplements, natural products, herbs, or vitamins without reviewing them with your caregiver. Certain supplements and natural food products can have high amounts of potassium. Other products (such as ibuprofen) can damage weak kidneys and raise your potassium.  You may be asked to do repeat lab tests. Be sure to follow these directions.  If you have kidney disease, you may need to follow a low potassium diet. SEEK MEDICAL CARE IF:   You notice an irregular or very slow heartbeat.  You feel lightheaded.  You develop weakness that is unusual for you. SEEK IMMEDIATE MEDICAL CARE IF:   You have shortness of breath.  You have chest discomfort.  You pass out (faint). MAKE SURE YOU:   Understand  these instructions.  Will watch your condition.  Will get help right away if you are not doing well or get worse. Document Released: 10/05/2002 Document Revised: 01/07/2012 Document Reviewed: 01/20/2014 [facility] Patient Information 2015 [place], [place]. This information is not intended to replace  advice given to you by your health care provider. Make sure you discuss any questions you have with your health care provider.    Anemia, Nonspecific Anemia is a condition in which the concentration of red blood cells or hemoglobin in the blood is below normal. Hemoglobin is a substance in red blood cells that carries oxygen to the tissues of the body. Anemia results in not enough oxygen reaching these tissues.  CAUSES  Common causes of anemia include:   Excessive bleeding. Bleeding may be internal or external. This includes excessive bleeding from periods (in women) or from the intestine.   Poor nutrition.   Chronic kidney, thyroid, and liver disease.  Bone marrow disorders that decrease red blood cell production.  Cancer and treatments for cancer.  HIV, AIDS, and their treatments.  Spleen problems that increase red blood cell destruction.  Blood disorders.  Excess destruction of red blood cells due to infection, medicines, and autoimmune disorders. SIGNS AND SYMPTOMS   Minor weakness.   Dizziness.   Headache.  Palpitations.   Shortness of breath, especially with exercise.   Paleness.  Cold sensitivity.  Indigestion.  Nausea.  Difficulty sleeping.  Difficulty concentrating. Symptoms may occur suddenly or they may develop slowly.  DIAGNOSIS  Additional blood tests are often needed. These help your health care provider determine the best treatment. Your health care provider will check your stool for blood and look for other causes of blood loss.  TREATMENT  Treatment varies depending on the cause of the anemia. Treatment can include:   Supplements of iron, vitamin O67, or folic acid.   Hormone medicines.   A blood transfusion. This may be needed if blood loss is severe.   Hospitalization. This may be needed if there is significant continual blood loss.   Dietary changes.  Spleen removal. HOME CARE INSTRUCTIONS Keep all follow-up  appointments. It often takes many weeks to correct anemia, and having your health care provider check on your condition and your response to treatment is very important. SEEK IMMEDIATE MEDICAL CARE IF:   You develop extreme weakness, shortness of breath, or chest pain.   You become dizzy or have trouble concentrating.  You develop heavy vaginal bleeding.   You develop a rash.   You have bloody or black, tarry stools.   You faint.   You vomit up blood.   You vomit repeatedly.   You have abdominal pain.  You have a fever or persistent symptoms for more than 2-3 days.   You have a fever and your symptoms suddenly get worse.   You are dehydrated.  MAKE SURE YOU:  Understand these instructions.  Will watch your condition.  Will get help right away if you are not doing well or get worse. Document Released: 11/22/2004 Document Revised: 06/17/2013 Document Reviewed: 04/10/2013 [facility] Patient Information 2015 [place], [place]. This information is not intended to replace advice given to you by your health care provider. Make sure you discuss any questions you have with your health care provider.   Gastrointestinal Bleeding Gastrointestinal (GI) bleeding means there is bleeding somewhere along the digestive tract, between the mouth and anus. CAUSES  There are many different problems that can cause GI bleeding. Possible causes include:  Esophagitis. This  is inflammation, irritation, or swelling of the esophagus.  Hemorrhoids.These are veins that are full of blood (engorged) in the rectum. They cause pain, inflammation, and may bleed.  Anal fissures.These are areas of painful tearing which may bleed. They are often caused by passing hard stool.  Diverticulosis.These are pouches that form on the colon over time, with age, and may bleed significantly.  Diverticulitis.This is inflammation in areas with diverticulosis. It can cause pain, fever, and bloody stools,  although bleeding is rare.  Polyps and cancer. Colon cancer often starts out as precancerous polyps.  Gastritis and ulcers.Bleeding from the upper gastrointestinal tract (near the stomach) may travel through the intestines and produce black, sometimes tarry, often bad smelling stools. In certain cases, if the bleeding is fast enough, the stools may not be black, but red. This condition may be life-threatening. SYMPTOMS   Vomiting bright red blood or material that looks like coffee grounds.  Bloody, black, or tarry stools. DIAGNOSIS  Your caregiver may diagnose your condition by taking your history and performing a physical exam. More tests may be needed, including:  X-rays and other imaging tests.  Esophagogastroduodenoscopy (EGD). This test uses a flexible, lighted tube to look at your esophagus, stomach, and small intestine.  Colonoscopy. This test uses a flexible, lighted tube to look at your colon. TREATMENT  Treatment depends on the cause of your bleeding.   For bleeding from the esophagus, stomach, small intestine, or colon, the caregiver doing your EGD or colonoscopy may be able to stop the bleeding as part of the procedure.  Inflammation or infection of the colon can be treated with medicines.  Many rectal problems can be treated with creams, suppositories, or warm baths.  Surgery is sometimes needed.  Blood transfusions are sometimes needed if you have lost a lot of blood. If bleeding is slow, you may be allowed to go home. If there is a lot of bleeding, you will need to stay in the hospital for observation. HOME CARE INSTRUCTIONS   Take any medicines exactly as prescribed.  Keep your stools soft by eating foods that are high in fiber. These foods include whole grains, legumes, fruits, and vegetables. Prunes (1 to 3 a day) work well for many people.  Drink enough fluids to keep your urine clear or pale yellow. SEEK IMMEDIATE MEDICAL CARE IF:   Your bleeding  increases.  You feel lightheaded, weak, or you faint.  You have severe cramps in your back or abdomen.  You pass large blood clots in your stool.  Your problems are getting worse. MAKE SURE YOU:   Understand these instructions.  Will watch your condition.  Will get help right away if you are not doing well or get worse. Document Released: 10/12/2000 Document Revised: 10/01/2012 Document Reviewed: 09/24/2011 [facility] Patient Information 2015 [place], [place]. This information is not intended to replace advice given to you by your health care provider. Make sure you discuss any questions you have with your health care provider.

## 2014-10-21 NOTE — ED Notes (Signed)
Pt is A&O x4.  Pt has no adverse reactions from blood products at this time and is in NAD

## 2014-10-21 NOTE — ED Notes (Signed)
Patient was in hemodialysis today and had shunt clot off and could not finish treatment, patient reportedly with bp in the 70's, at ems arrival bp in low 100's, patient with no c/o , patient also states hgb was low this past week and dialysis center wanted him to be evaluated same

## 2014-10-22 ENCOUNTER — Emergency Department (HOSPITAL_COMMUNITY): Payer: Non-veteran care

## 2014-10-22 ENCOUNTER — Inpatient Hospital Stay (HOSPITAL_COMMUNITY)
Admission: EM | Admit: 2014-10-22 | Discharge: 2014-10-24 | DRG: 977 | Disposition: A | Discharge status: Home / Self Care | Payer: Non-veteran care | Attending: Internal Medicine | Admitting: Internal Medicine

## 2014-10-22 DIAGNOSIS — Z794 Long term (current) use of insulin: Secondary | ICD-10-CM

## 2014-10-22 DIAGNOSIS — K759 Inflammatory liver disease, unspecified: Secondary | ICD-10-CM | POA: Diagnosis present

## 2014-10-22 DIAGNOSIS — E1129 Type 2 diabetes mellitus with other diabetic kidney complication: Secondary | ICD-10-CM | POA: Diagnosis present

## 2014-10-22 DIAGNOSIS — K922 Gastrointestinal hemorrhage, unspecified: Secondary | ICD-10-CM | POA: Diagnosis present

## 2014-10-22 DIAGNOSIS — Z992 Dependence on renal dialysis: Secondary | ICD-10-CM

## 2014-10-22 DIAGNOSIS — F17210 Nicotine dependence, cigarettes, uncomplicated: Secondary | ICD-10-CM | POA: Diagnosis present

## 2014-10-22 DIAGNOSIS — Z885 Allergy status to narcotic agent status: Secondary | ICD-10-CM

## 2014-10-22 DIAGNOSIS — I120 Hypertensive chronic kidney disease with stage 5 chronic kidney disease or end stage renal disease: Secondary | ICD-10-CM | POA: Diagnosis present

## 2014-10-22 DIAGNOSIS — N2581 Secondary hyperparathyroidism of renal origin: Secondary | ICD-10-CM | POA: Diagnosis present

## 2014-10-22 DIAGNOSIS — I739 Peripheral vascular disease, unspecified: Secondary | ICD-10-CM | POA: Diagnosis present

## 2014-10-22 DIAGNOSIS — N186 End stage renal disease: Secondary | ICD-10-CM | POA: Diagnosis present

## 2014-10-22 DIAGNOSIS — Z79899 Other long term (current) drug therapy: Secondary | ICD-10-CM

## 2014-10-22 DIAGNOSIS — Q2733 Arteriovenous malformation of digestive system vessel: Secondary | ICD-10-CM

## 2014-10-22 DIAGNOSIS — E1122 Type 2 diabetes mellitus with diabetic chronic kidney disease: Secondary | ICD-10-CM

## 2014-10-22 DIAGNOSIS — D649 Anemia, unspecified: Principal | ICD-10-CM | POA: Diagnosis present

## 2014-10-22 DIAGNOSIS — N189 Chronic kidney disease, unspecified: Secondary | ICD-10-CM

## 2014-10-22 DIAGNOSIS — Z89412 Acquired absence of left great toe: Secondary | ICD-10-CM

## 2014-10-22 DIAGNOSIS — D696 Thrombocytopenia, unspecified: Secondary | ICD-10-CM | POA: Diagnosis present

## 2014-10-22 DIAGNOSIS — I10 Essential (primary) hypertension: Secondary | ICD-10-CM | POA: Diagnosis present

## 2014-10-22 DIAGNOSIS — B20 Human immunodeficiency virus [HIV] disease: Secondary | ICD-10-CM | POA: Diagnosis present

## 2014-10-22 DIAGNOSIS — R0602 Shortness of breath: Secondary | ICD-10-CM

## 2014-10-22 DIAGNOSIS — E1140 Type 2 diabetes mellitus with diabetic neuropathy, unspecified: Secondary | ICD-10-CM | POA: Diagnosis present

## 2014-10-22 LAB — CBC WITH DIFFERENTIAL/PLATELET
Basophils Absolute: 0 10*3/uL (ref 0.0–0.1)
Basophils Relative: 1 % (ref 0–1)
Eosinophils Absolute: 0.2 10*3/uL (ref 0.0–0.7)
Eosinophils Relative: 4 % (ref 0–5)
HCT: 23.8 % — ABNORMAL LOW (ref 39.0–52.0)
Hemoglobin: 7.2 g/dL — ABNORMAL LOW (ref 13.0–17.0)
Lymphocytes Relative: 27 % (ref 12–46)
Lymphs Abs: 1.4 10*3/uL (ref 0.7–4.0)
MCH: 28.6 pg (ref 26.0–34.0)
MCHC: 30.3 g/dL (ref 30.0–36.0)
MCV: 94.4 fL (ref 78.0–100.0)
Monocytes Absolute: 0.3 10*3/uL (ref 0.1–1.0)
Monocytes Relative: 6 % (ref 3–12)
Neutro Abs: 3.4 10*3/uL (ref 1.7–7.7)
Neutrophils Relative %: 63 % (ref 43–77)
Platelets: 86 10*3/uL — ABNORMAL LOW (ref 150–400)
RBC: 2.52 MIL/uL — ABNORMAL LOW (ref 4.22–5.81)
RDW: 18.5 % — ABNORMAL HIGH (ref 11.5–15.5)
WBC: 5.4 10*3/uL (ref 4.0–10.5)

## 2014-10-22 LAB — TYPE AND SCREEN
ABO/RH(D): O POS
Antibody Screen: NEGATIVE
Unit division: 0

## 2014-10-22 LAB — COMPREHENSIVE METABOLIC PANEL
ALT: 11 U/L (ref 0–53)
AST: 21 U/L (ref 0–37)
Albumin: 2.8 g/dL — ABNORMAL LOW (ref 3.5–5.2)
Alkaline Phosphatase: 118 U/L — ABNORMAL HIGH (ref 39–117)
Anion gap: 20 — ABNORMAL HIGH (ref 5–15)
BUN: 65 mg/dL — ABNORMAL HIGH (ref 6–23)
CO2: 20 mmol/L (ref 19–32)
Calcium: 7.6 mg/dL — ABNORMAL LOW (ref 8.4–10.5)
Chloride: 92 mEq/L — ABNORMAL LOW (ref 96–112)
Creatinine, Ser: 12.89 mg/dL — ABNORMAL HIGH (ref 0.50–1.35)
GFR calc Af Amer: 4 mL/min — ABNORMAL LOW (ref >90)
GFR calc non Af Amer: 3 mL/min — ABNORMAL LOW (ref >90)
Glucose, Bld: 357 mg/dL — ABNORMAL HIGH (ref 70–99)
Potassium: 5.1 mmol/L (ref 3.5–5.1)
Sodium: 132 mmol/L — ABNORMAL LOW (ref 135–145)
Total Bilirubin: 0.4 mg/dL (ref 0.3–1.2)
Total Protein: 6.4 g/dL (ref 6.0–8.3)

## 2014-10-22 LAB — I-STAT TROPONIN, ED: Troponin i, poc: 0 ng/mL (ref 0.00–0.08)

## 2014-10-22 LAB — PREPARE RBC (CROSSMATCH)

## 2014-10-22 MED ORDER — SODIUM CHLORIDE 0.9 % IV SOLN
10.0000 mL/h | Freq: Once | INTRAVENOUS | Status: AC
  Administered 2014-10-23: 10 mL/h via INTRAVENOUS

## 2014-10-22 NOTE — ED Notes (Addendum)
After dialysis yesterday where he had to get potassium and x 1 unit of blood, today did some traveling and wore out and felt his heart beating fast. Last night, episode where he got real hot. bp drop x 3 days AGO. Home oxygen and uses it only at night.

## 2014-10-22 NOTE — H&P (Signed)
[facility] History and Physical  Patient: Benjamin Long  XYI:[id number]  DOB: [birth year]  DOS: the patient was seen and examined on 10/22/2014 PCP: Maggie Font, MD  Chief Complaint: Fatigue and tiredness  HPI: Benjamin Long is a 68 y.o. male with Past medical history of ESRD on hemodialysis, HIV, hypertension, hepatitis, diabetes mellitus, recurrent GI bleeding, peripheral vascular disease. The patient is presenting with complaints of fatigue and tiredness. Patient was seen in the ER yesterday for the complaint of fatigue and tiredness at which time he was found to be anemic with guaiac positive stool. Patient was recommended hospital admission but preferred to be discharged home. After discharging home patient had 6-7 bowel movements which are loose and watery without any blood. He denied any black color bowel movement. He was given Kayexalate while she was here in the hospital 50 g. Throughout the day he was feeling fatigue and tired and was having some shortness of breath on exertion and therefore he decided to come to the hospital again. He mentions this other symptoms that he gets whenever he is anemic. He denies any active bleeding. He denies any cough chest pain dizziness lightheadedness fever or chills. He complains of severe pain on his hip on both that yesterday when he was walking in the pharmacy store which is resolved at present. He mentions he has a history of peripheral vascular disease and has recently evaluated with an angiogram in [place] in the last 1 month. Patient was recently hospitalized for symptomatic GI bleed and was [facility]. he mentions he has undergone colonoscopy followed by upper endoscopy. Colonoscopy was showing only 1 polyp without any active bleeding. The upper endoscopy for the patient was showing 20 bleeding spot in his stomach and he mentions they "burned" them. No other changes in medications reported  The  patient is coming from home And at his baseline independent for most of his ADL.  Review of Systems: as mentioned in the history of present illness.  A Comprehensive review of the other systems is negative.  Past Medical History  Diagnosis Date  . ESRD on peritoneal dialysis   . HIV disease   . Hypertension   . Shortness of breath   . DM type 2 causing renal disease   . Neuromuscular disorder     tremers  . Cancer     hx of rectal cancer  . Anemia 09/02/2012  . Hepatitis     hx of hepatitis B  . Iron (Fe) deficiency anemia 09/02/2012  . Peripheral vascular disease   . Osteomyelitis    Past Surgical History  Procedure Laterality Date  . Hemorroidectomy    . Insertion of dialysis catheter    . Hernia repair      TESTICLE  . Amputation of big toe Left april 2015   Social History:  reports that he quit smoking about 9 months ago. His smoking use included Cigarettes. He has a 12.5 pack-year smoking history. He has never used smokeless tobacco. He reports that he does not drink alcohol or use illicit drugs.  Allergies  Allergen Reactions  . Codeine Nausea And Vomiting and Nausea Only    Family History  Problem Relation Age of Onset  . Diabetes Mother   . Cancer Mother     gallbladder  . Heart disease Father   . Cancer Sister     gallbladder    Prior to Admission medications   Medication Sig Start Date End Date Taking? Authorizing Provider  allopurinol (ZYLOPRIM) 100 MG tablet Take 100 mg by mouth daily.    Historical Provider, MD  calcium acetate (PHOSLO) 667 MG capsule Take 1,334-2,668 mg by mouth See admin instructions. Takes 3 capsules with meals and 2 capsules with snacks    Historical Provider, MD  camphor-menthol (SARNA) lotion Apply 1 application topically 3 (three) times daily as needed for itching.    Historical Provider, MD  capsicum (CAPSAICIN APR) 0.075 % topical cream Apply 1 application topically daily as needed (pain).    Historical Provider, MD   carvedilol (COREG) 3.125 MG tablet Take 3.125 mg by mouth See admin instructions. Take twice daily on non HD days, Take once daily on HD days    Historical Provider, MD  fosamprenavir (LEXIVA) 700 MG tablet Take 700 mg by mouth 2 (two) times daily with a meal. 09/05/12   Eugenie Filler, MD  gabapentin (NEURONTIN) 300 MG capsule Take 300-600 mg by mouth 2 (two) times daily. 1 cap in am and 2 caps in pm    Historical Provider, MD  insulin glargine (LANTUS) 100 UNIT/ML injection Inject 2-15 Units into the skin every morning. Per sliding scale    Historical Provider, MD  insulin regular (NOVOLIN R,HUMULIN R) 100 units/mL injection Inject 2-14 Units into the skin 3 (three) times daily before meals. Pt takes anywhere from 6-14 units as directed per sliding scale    Historical Provider, MD  latanoprost (XALATAN) 0.005 % ophthalmic solution Place 1 drop into both eyes at bedtime.    Historical Provider, MD  nortriptyline (PAMELOR) 25 MG capsule Take 50 mg by mouth at bedtime.    Historical Provider, MD  oxyCODONE (OXY IR/ROXICODONE) 5 MG immediate release tablet Take 10 mg by mouth every 4 (four) hours.    Historical Provider, MD  pantoprazole (PROTONIX) 40 MG tablet Take 40 mg by mouth 2 (two) times daily.    Historical Provider, MD  polyvinyl alcohol (LIQUIFILM TEARS) 1.4 % ophthalmic solution Place 1 drop into both eyes 4 (four) times daily.    Historical Provider, MD  pravastatin (PRAVACHOL) 40 MG tablet Take 20 mg by mouth every evening.    Historical Provider, MD  raltegravir (ISENTRESS) 400 MG tablet Take 400 mg by mouth 2 (two) times daily.    Historical Provider, MD  ritonavir (NORVIR) 100 MG capsule Take 100 mg by mouth 2 (two) times daily.    Historical Provider, MD  sildenafil (VIAGRA) 50 MG tablet Take 50 mg by mouth daily as needed. For erectile dysfunction    Historical Provider, MD  simethicone (MYLICON) 80 MG chewable tablet Chew 240 mg by mouth 4 (four) times daily as needed for  flatulence.    Historical Provider, MD  sodium polystyrene (KAYEXALATE) powder Take by mouth once. Take 50g by mouth tomorrow. 10/21/14   Ernestina Patches, MD  tenofovir (VIREAD) 300 MG tablet Take 300 mg by mouth every 7 (seven) days. Take on Tuesdays after hemodialysis.    Historical Provider, MD  urea (CARMOL) 20 % cream Apply 1 application topically 2 (two) times daily as needed (dry feet).    Historical Provider, MD    Physical Exam: Filed Vitals:   10/22/14 1856  BP: 115/39  Pulse: 80  Temp: 97.6 F (36.4 C)  TempSrc: Oral  Resp: 14  SpO2: 100%    General: Alert, Awake and Oriented to Time, Place and Person. Appear in mild distress Eyes: PERRL ENT: Oral Mucosa clear moist. Neck: no JVD Cardiovascular: S1 and S2 Present, no Murmur,  Peripheral Pulses Present Respiratory: Bilateral Air entry equal and Decreased, Clear to Auscultation, noCrackles, no wheezes Abdomen: Bowel Sound present, Soft and non tender Skin: no Rash Extremities: no Pedal edema, no calf tenderness Neurologic: Grossly no focal neuro deficit.  Labs on Admission:  CBC:  Recent Labs Lab 10/21/14 1300 10/22/14 1921  WBC 4.3 5.4  NEUTROABS 2.4 3.4  HGB 7.2* 7.2*  HCT 24.4* 23.8*  MCV 96.8 94.4  PLT 77* 86*    CMP     Component Value Date/Time   NA 132* 10/22/2014 1921   K 5.1 10/22/2014 1921   CL 92* 10/22/2014 1921   CO2 20 10/22/2014 1921   GLUCOSE 357* 10/22/2014 1921   BUN 65* 10/22/2014 1921   CREATININE 12.89* 10/22/2014 1921   CALCIUM 7.6* 10/22/2014 1921   PROT 6.4 10/22/2014 1921   ALBUMIN 2.8* 10/22/2014 1921   AST 21 10/22/2014 1921   ALT 11 10/22/2014 1921   ALKPHOS 118* 10/22/2014 1921   BILITOT 0.4 10/22/2014 1921   GFRNONAA 3* 10/22/2014 1921   GFRAA 4* 10/22/2014 1921    No results for input(s): LIPASE, AMYLASE in the last 168 hours. No results for input(s): AMMONIA in the last 168 hours.  No results for input(s): CKTOTAL, CKMB, CKMBINDEX, TROPONINI in the last 168  hours. BNP (last 3 results) No results for input(s): PROBNP in the last 8760 hours.  Radiological Exams on Admission: Dg Chest 2 View  10/22/2014   CLINICAL DATA:  Chest pain and short of breath.  EXAM: CHEST  2 VIEW  COMPARISON:  08/26/2014  FINDINGS: Normal heart size. Calcified granulomata in the left upper lobe. Otherwise clear lungs. No pneumothorax. No pleural effusion. Stable skeleton.  IMPRESSION: No active cardiopulmonary disease.   Electronically Signed   By: Maryclare Bean M.D.   On: 10/22/2014 20:44    Assessment/Plan Principal Problem:   Symptomatic anemia Active Problems:   HIV disease   Hypertension   DM type 2 causing renal disease   Hepatitis   ESRD (end stage renal disease)   GI bleed   1. Symptomatic anemia The patient is presenting with complaints of fatigue tiredness and weakness. On further workup is found to have hemoglobin of 7.2 with this despite receiving one unit of blood transfusion yesterday. With this the patient will be admitted in the hospital. I would continue monitoring his serial H&H He will receive one unit of PRBC. We will try to obtain records from [place] in the morning. IV Protonix twice a day.  2. HIV disease. Continue home medication present.  3. Essential hypertension. Blood pressure is stable continue home medication.  4. ESRD on hemodialysis. Nephrology called for continuation of hemodialysis.  5. Thrombocytopenia. Etiology currently unclear. May require further workup. Possibly due to repeated blood transfusion with PRBC.   Advance goals of care discussion: Full code   Consults: Nephrology phone line  DVT Prophylaxis: mechanical compression device Nutrition: Clear liquid diet  Disposition: Admitted to inpatient in telemetry unit.  Author: Berle Mull, MD [facility] Pager: [phone] 10/22/2014, 11:57 PM    If 7PM-7AM, please contact night-coverage [email or web address] Password [email or web address]

## 2014-10-23 ENCOUNTER — Encounter (HOSPITAL_COMMUNITY): Payer: Self-pay | Admitting: *Deleted

## 2014-10-23 DIAGNOSIS — Q2733 Arteriovenous malformation of digestive system vessel: Secondary | ICD-10-CM | POA: Diagnosis not present

## 2014-10-23 DIAGNOSIS — D696 Thrombocytopenia, unspecified: Secondary | ICD-10-CM | POA: Diagnosis present

## 2014-10-23 DIAGNOSIS — E1140 Type 2 diabetes mellitus with diabetic neuropathy, unspecified: Secondary | ICD-10-CM | POA: Diagnosis present

## 2014-10-23 DIAGNOSIS — Z885 Allergy status to narcotic agent status: Secondary | ICD-10-CM | POA: Diagnosis not present

## 2014-10-23 DIAGNOSIS — Z794 Long term (current) use of insulin: Secondary | ICD-10-CM | POA: Diagnosis not present

## 2014-10-23 DIAGNOSIS — I739 Peripheral vascular disease, unspecified: Secondary | ICD-10-CM | POA: Diagnosis present

## 2014-10-23 DIAGNOSIS — I120 Hypertensive chronic kidney disease with stage 5 chronic kidney disease or end stage renal disease: Secondary | ICD-10-CM | POA: Diagnosis present

## 2014-10-23 DIAGNOSIS — K759 Inflammatory liver disease, unspecified: Secondary | ICD-10-CM | POA: Diagnosis present

## 2014-10-23 DIAGNOSIS — Z992 Dependence on renal dialysis: Secondary | ICD-10-CM | POA: Diagnosis not present

## 2014-10-23 DIAGNOSIS — Z79899 Other long term (current) drug therapy: Secondary | ICD-10-CM | POA: Diagnosis not present

## 2014-10-23 DIAGNOSIS — N2581 Secondary hyperparathyroidism of renal origin: Secondary | ICD-10-CM | POA: Diagnosis present

## 2014-10-23 DIAGNOSIS — R0602 Shortness of breath: Secondary | ICD-10-CM | POA: Diagnosis present

## 2014-10-23 DIAGNOSIS — F17210 Nicotine dependence, cigarettes, uncomplicated: Secondary | ICD-10-CM | POA: Diagnosis present

## 2014-10-23 DIAGNOSIS — Z89412 Acquired absence of left great toe: Secondary | ICD-10-CM | POA: Diagnosis not present

## 2014-10-23 DIAGNOSIS — K922 Gastrointestinal hemorrhage, unspecified: Secondary | ICD-10-CM | POA: Diagnosis present

## 2014-10-23 DIAGNOSIS — D649 Anemia, unspecified: Secondary | ICD-10-CM | POA: Diagnosis present

## 2014-10-23 DIAGNOSIS — B20 Human immunodeficiency virus [HIV] disease: Secondary | ICD-10-CM | POA: Diagnosis present

## 2014-10-23 DIAGNOSIS — N186 End stage renal disease: Secondary | ICD-10-CM | POA: Diagnosis present

## 2014-10-23 LAB — HEMOGLOBIN A1C
Hgb A1c MFr Bld: 6 % — ABNORMAL HIGH (ref <5.7)
Mean Plasma Glucose: 126 mg/dL — ABNORMAL HIGH (ref <117)

## 2014-10-23 LAB — CBC
HCT: 27.3 % — ABNORMAL LOW (ref 39.0–52.0)
Hemoglobin: 8.6 g/dL — ABNORMAL LOW (ref 13.0–17.0)
MCH: 28.5 pg (ref 26.0–34.0)
MCHC: 31.5 g/dL (ref 30.0–36.0)
MCV: 90.4 fL (ref 78.0–100.0)
PLATELETS: 83 10*3/uL — AB (ref 150–400)
RBC: 3.02 MIL/uL — ABNORMAL LOW (ref 4.22–5.81)
RDW: 18.6 % — AB (ref 11.5–15.5)
WBC: 4.8 10*3/uL (ref 4.0–10.5)

## 2014-10-23 LAB — COMPREHENSIVE METABOLIC PANEL
ALBUMIN: 2.6 g/dL — AB (ref 3.5–5.2)
ALK PHOS: 128 U/L — AB (ref 39–117)
ALT: 10 U/L (ref 0–53)
AST: 15 U/L (ref 0–37)
Anion gap: 16 — ABNORMAL HIGH (ref 5–15)
BILIRUBIN TOTAL: 0.5 mg/dL (ref 0.3–1.2)
BUN: 66 mg/dL — AB (ref 6–23)
CHLORIDE: 95 meq/L — AB (ref 96–112)
CO2: 23 mmol/L (ref 19–32)
Calcium: 7.6 mg/dL — ABNORMAL LOW (ref 8.4–10.5)
Creatinine, Ser: 13.27 mg/dL — ABNORMAL HIGH (ref 0.50–1.35)
GFR calc Af Amer: 4 mL/min — ABNORMAL LOW (ref >90)
GFR, EST NON AFRICAN AMERICAN: 3 mL/min — AB (ref >90)
Glucose, Bld: 205 mg/dL — ABNORMAL HIGH (ref 70–99)
POTASSIUM: 4.4 mmol/L (ref 3.5–5.1)
Sodium: 134 mmol/L — ABNORMAL LOW (ref 135–145)
Total Protein: 6 g/dL (ref 6.0–8.3)

## 2014-10-23 LAB — IRON AND TIBC
Iron: 81 ug/dL (ref 42–165)
Saturation Ratios: 25 % (ref 20–55)
TIBC: 328 ug/dL (ref 215–435)
UIBC: 247 ug/dL (ref 125–400)

## 2014-10-23 LAB — RETICULOCYTES
RBC.: 3.21 MIL/uL — ABNORMAL LOW (ref 4.22–5.81)
Retic Count, Absolute: 211.9 10*3/uL — ABNORMAL HIGH (ref 19.0–186.0)
Retic Ct Pct: 6.6 % — ABNORMAL HIGH (ref 0.4–3.1)

## 2014-10-23 LAB — PROTIME-INR
INR: 1.05 (ref 0.00–1.49)
Prothrombin Time: 13.8 seconds (ref 11.6–15.2)

## 2014-10-23 LAB — VITAMIN B12: Vitamin B-12: 795 pg/mL (ref 211–911)

## 2014-10-23 LAB — GLUCOSE, CAPILLARY
GLUCOSE-CAPILLARY: 131 mg/dL — AB (ref 70–99)
GLUCOSE-CAPILLARY: 169 mg/dL — AB (ref 70–99)
Glucose-Capillary: 134 mg/dL — ABNORMAL HIGH (ref 70–99)
Glucose-Capillary: 137 mg/dL — ABNORMAL HIGH (ref 70–99)
Glucose-Capillary: 327 mg/dL — ABNORMAL HIGH (ref 70–99)

## 2014-10-23 LAB — FERRITIN: Ferritin: 123 ng/mL (ref 22–322)

## 2014-10-23 LAB — FOLATE

## 2014-10-23 MED ORDER — CALCIUM ACETATE 667 MG PO CAPS: 1334.0000 mg | ORAL_CAPSULE | ORAL | Status: DC | PRN

## 2014-10-23 MED ORDER — CALCIUM ACETATE 667 MG PO CAPS
2001.0000 mg | ORAL_CAPSULE | Freq: Three times a day (TID) | ORAL | Status: DC
  Administered 2014-10-23 – 2014-10-24 (×4): 2001 mg via ORAL
  Filled 2014-10-23 (×6): qty 3

## 2014-10-23 MED ORDER — SIMETHICONE 80 MG PO CHEW
240.0000 mg | CHEWABLE_TABLET | Freq: Four times a day (QID) | ORAL | Status: DC | PRN
  Filled 2014-10-23: qty 3

## 2014-10-23 MED ORDER — ONDANSETRON HCL 4 MG/2ML IJ SOLN: 4.0000 mg | Freq: Four times a day (QID) | INTRAMUSCULAR | Status: DC | PRN

## 2014-10-23 MED ORDER — ACETAMINOPHEN 325 MG PO TABS: 650.0000 mg | ORAL_TABLET | Freq: Four times a day (QID) | ORAL | Status: DC | PRN

## 2014-10-23 MED ORDER — RITONAVIR 100 MG PO CAPS
100.0000 mg | ORAL_CAPSULE | Freq: Two times a day (BID) | ORAL | Status: DC
  Administered 2014-10-23 – 2014-10-24 (×3): 100 mg via ORAL
  Filled 2014-10-23 (×5): qty 1

## 2014-10-23 MED ORDER — RALTEGRAVIR POTASSIUM 400 MG PO TABS
400.0000 mg | ORAL_TABLET | Freq: Two times a day (BID) | ORAL | Status: DC
  Administered 2014-10-23 – 2014-10-24 (×4): 400 mg via ORAL
  Filled 2014-10-23 (×6): qty 1

## 2014-10-23 MED ORDER — TENOFOVIR DISOPROXIL FUMARATE 300 MG PO TABS: 300.0000 mg | ORAL_TABLET | ORAL | Status: DC

## 2014-10-23 MED ORDER — GABAPENTIN 300 MG PO CAPS
300.0000 mg | ORAL_CAPSULE | Freq: Every day | ORAL | Status: DC
  Administered 2014-10-23 – 2014-10-24 (×2): 300 mg via ORAL
  Filled 2014-10-23 (×2): qty 1

## 2014-10-23 MED ORDER — GABAPENTIN 600 MG PO TABS
600.0000 mg | ORAL_TABLET | Freq: Every day | ORAL | Status: DC
  Administered 2014-10-23 (×2): 600 mg via ORAL
  Filled 2014-10-23 (×3): qty 1

## 2014-10-23 MED ORDER — PRAVASTATIN SODIUM 20 MG PO TABS
20.0000 mg | ORAL_TABLET | Freq: Every evening | ORAL | Status: DC
  Administered 2014-10-23: 20 mg via ORAL
  Filled 2014-10-23 (×2): qty 1

## 2014-10-23 MED ORDER — NORTRIPTYLINE HCL 25 MG PO CAPS
50.0000 mg | ORAL_CAPSULE | Freq: Every day | ORAL | Status: DC
  Administered 2014-10-23 (×2): 50 mg via ORAL
  Filled 2014-10-23 (×3): qty 2

## 2014-10-23 MED ORDER — INSULIN ASPART 100 UNIT/ML ~~LOC~~ SOLN
0.0000 [IU] | Freq: Four times a day (QID) | SUBCUTANEOUS | Status: DC
  Administered 2014-10-23: 11 [IU] via SUBCUTANEOUS
  Administered 2014-10-23 (×2): 2 [IU] via SUBCUTANEOUS
  Administered 2014-10-23: 3 [IU] via SUBCUTANEOUS
  Administered 2014-10-24 (×2): 2 [IU] via SUBCUTANEOUS

## 2014-10-23 MED ORDER — PANTOPRAZOLE SODIUM 40 MG IV SOLR
40.0000 mg | Freq: Two times a day (BID) | INTRAVENOUS | Status: DC
  Administered 2014-10-23 (×3): 40 mg via INTRAVENOUS
  Filled 2014-10-23 (×5): qty 40

## 2014-10-23 MED ORDER — CALCIUM ACETATE 667 MG PO CAPS
1334.0000 mg | ORAL_CAPSULE | ORAL | Status: DC
Start: 2014-10-23 — End: 2014-10-23

## 2014-10-23 MED ORDER — ACETAMINOPHEN 650 MG RE SUPP: 650.0000 mg | Freq: Four times a day (QID) | RECTAL | Status: DC | PRN

## 2014-10-23 MED ORDER — OXYCODONE HCL 5 MG PO TABS
10.0000 mg | ORAL_TABLET | ORAL | Status: DC
  Administered 2014-10-23 – 2014-10-24 (×8): 10 mg via ORAL
  Filled 2014-10-23 (×8): qty 2

## 2014-10-23 MED ORDER — ALLOPURINOL 100 MG PO TABS
100.0000 mg | ORAL_TABLET | Freq: Every day | ORAL | Status: DC
  Administered 2014-10-23 – 2014-10-24 (×2): 100 mg via ORAL
  Filled 2014-10-23 (×2): qty 1

## 2014-10-23 MED ORDER — ONDANSETRON HCL 4 MG PO TABS: 4.0000 mg | ORAL_TABLET | Freq: Four times a day (QID) | ORAL | Status: DC | PRN

## 2014-10-23 MED ORDER — FOSAMPRENAVIR CALCIUM 700 MG PO TABS
700.0000 mg | ORAL_TABLET | Freq: Two times a day (BID) | ORAL | Status: DC
  Administered 2014-10-23 – 2014-10-24 (×2): 700 mg via ORAL
  Filled 2014-10-23 (×5): qty 1

## 2014-10-23 MED ORDER — SODIUM CHLORIDE 0.9 % IJ SOLN
3.0000 mL | Freq: Two times a day (BID) | INTRAMUSCULAR | Status: DC
  Administered 2014-10-23 (×2): 3 mL via INTRAVENOUS

## 2014-10-23 MED ORDER — GABAPENTIN 300 MG PO CAPS: 300.0000 mg | ORAL_CAPSULE | Freq: Two times a day (BID) | ORAL | Status: DC

## 2014-10-23 NOTE — Consult Note (Signed)
[facility] Renal Consultation Note  Indication for Consultation:  Management of ESRD/hemodialysis; anemia, hypertension/volume and secondary hyperparathyroidism  HPI: Benjamin Long is a 68 y.o. male with a history of HIV, Hepatitis B, Diabetes Type 2, recurrent GI bleeding, PVD, and ESRD on dialysis at the [facility] who presented to the ER yesterday with worsening fatigue and dyspnea on exertion and was found to have hemoglobin of 7.2.  He received one unit of packed RBCs early this morning, and his hemoglobin has improved to 8.6.  He has a history of recurrent GI bleeding, secondary to AVMs, and occasionally requires transfusions, most recently on 11/16 at the [place].  His dialysis was stopped a few minutes early on 12/24 when his dialyzer clotted.  His dialysis access was also believed to be clotted, and when he could not be stabilized with IV fluid for low blood pressure, he was sent to the ER.  At that time he had symptomatic anemia, but did not want to stay for evaluation.  Yesterday he felt increasingly tired with dyspnea on exertion and reported several loose-to-watery stools with no noticeable blood, but after receiving Kayexalate.  Currently he is feeling better and has no complaints, but has not been ambulating.  Dialysis Orders:  TTS @ [place]   4 hrs       92 kg      2K/2.25Ca      400/800      No Heparin      AVF @ [facility]  No Hectorol            Aranesp 200 mcg & Venofer 100 mg on Weds    Past Medical History  Diagnosis Date  . ESRD on peritoneal dialysis   . HIV disease   . Hypertension   . Shortness of breath   . DM type 2 causing renal disease   . Neuromuscular disorder     tremers  . Cancer     hx of rectal cancer  . Anemia 09/02/2012  . Hepatitis     hx of hepatitis B  . Iron (Fe) deficiency anemia 09/02/2012  . Peripheral vascular disease   . Osteomyelitis    Past Surgical History  Procedure Laterality Date  . Hemorroidectomy    .  Insertion of dialysis catheter    . Hernia repair      TESTICLE  . Amputation of big toe Left april 2015   Family History  Problem Relation Age of Onset  . Diabetes Mother   . Cancer Mother     gallbladder  . Heart disease Father   . Cancer Sister     gallbladder   Social History He has smoked cigarettes for most of his life and currently smokes five a day.  He quit alcohol 11 years ago and denies any history of illicit drugs.  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  Allergies  Allergen Reactions  . Codeine Nausea And Vomiting and Nausea Only   Prior to Admission medications   Medication Sig Start Date End Date Taking? Authorizing Provider  allopurinol (ZYLOPRIM) 100 MG tablet Take 100 mg by mouth daily.    Historical Provider, MD  calcium acetate (PHOSLO) 667 MG capsule Take 1,334-2,668 mg by mouth See admin instructions. Takes 3 capsules with meals and 2 capsules with snacks    Historical Provider, MD  camphor-menthol (SARNA) lotion Apply 1 application topically 3 (three) times daily as needed for itching.  Historical Provider, MD  capsicum (CAPSAICIN APR) 0.075 % topical cream Apply 1 application topically daily as needed (pain).    Historical Provider, MD  carvedilol (COREG) 3.125 MG tablet Take 3.125 mg by mouth See admin instructions. Take twice daily on non HD days, Take once daily on HD days    Historical Provider, MD  fosamprenavir (LEXIVA) 700 MG tablet Take 700 mg by mouth 2 (two) times daily with a meal. 09/05/12   Eugenie Filler, MD  gabapentin (NEURONTIN) 300 MG capsule Take 300-600 mg by mouth 2 (two) times daily. 1 cap in am and 2 caps in pm    Historical Provider, MD  insulin glargine (LANTUS) 100 UNIT/ML injection Inject 2-15 Units into the skin every morning. Per sliding scale    Historical Provider, MD  insulin regular (NOVOLIN R,HUMULIN R) 100 units/mL injection Inject 2-14 Units into the skin 3 (three) times daily before meals. Pt  takes anywhere from 6-14 units as directed per sliding scale    Historical Provider, MD  latanoprost (XALATAN) 0.005 % ophthalmic solution Place 1 drop into both eyes at bedtime.    Historical Provider, MD  nortriptyline (PAMELOR) 25 MG capsule Take 50 mg by mouth at bedtime.    Historical Provider, MD  oxyCODONE (OXY IR/ROXICODONE) 5 MG immediate release tablet Take 10 mg by mouth every 4 (four) hours.    Historical Provider, MD  pantoprazole (PROTONIX) 40 MG tablet Take 40 mg by mouth 2 (two) times daily.    Historical Provider, MD  polyvinyl alcohol (LIQUIFILM TEARS) 1.4 % ophthalmic solution Place 1 drop into both eyes 4 (four) times daily.    Historical Provider, MD  pravastatin (PRAVACHOL) 40 MG tablet Take 20 mg by mouth every evening.    Historical Provider, MD  raltegravir (ISENTRESS) 400 MG tablet Take 400 mg by mouth 2 (two) times daily.    Historical Provider, MD  ritonavir (NORVIR) 100 MG capsule Take 100 mg by mouth 2 (two) times daily.    Historical Provider, MD  sildenafil (VIAGRA) 50 MG tablet Take 50 mg by mouth daily as needed. For erectile dysfunction    Historical Provider, MD  simethicone (MYLICON) 80 MG chewable tablet Chew 240 mg by mouth 4 (four) times daily as needed for flatulence.    Historical Provider, MD  sodium polystyrene (KAYEXALATE) powder Take by mouth once. Take 50g by mouth tomorrow. 10/21/14   Ernestina Patches, MD  tenofovir (VIREAD) 300 MG tablet Take 300 mg by mouth every 7 (seven) days. Take on Tuesdays after hemodialysis.    Historical Provider, MD  urea (CARMOL) 20 % cream Apply 1 application topically 2 (two) times daily as needed (dry feet).    Historical Provider, MD   Labs:  Results for orders placed or performed during the hospital encounter of 10/22/14 (from the past 48 hour(s))  CBC with Differential     Status: Abnormal   Collection Time: 10/22/14  7:21 PM  Result Value Ref Range   WBC 5.4 4.0 - 10.5 K/uL   RBC 2.52 (L) 4.22 - 5.81 MIL/uL    Hemoglobin 7.2 (L) 13.0 - 17.0 g/dL   HCT 23.8 (L) 39.0 - 52.0 %   MCV 94.4 78.0 - 100.0 fL   MCH 28.6 26.0 - 34.0 pg   MCHC 30.3 30.0 - 36.0 g/dL   RDW 18.5 (H) 11.5 - 15.5 %   Platelets 86 (L) 150 - 400 K/uL    Comment: CONSISTENT WITH PREVIOUS RESULT   Neutrophils Relative %  63 43 - 77 %   Neutro Abs 3.4 1.7 - 7.7 K/uL   Lymphocytes Relative 27 12 - 46 %   Lymphs Abs 1.4 0.7 - 4.0 K/uL   Monocytes Relative 6 3 - 12 %   Monocytes Absolute 0.3 0.1 - 1.0 K/uL   Eosinophils Relative 4 0 - 5 %   Eosinophils Absolute 0.2 0.0 - 0.7 K/uL   Basophils Relative 1 0 - 1 %   Basophils Absolute 0.0 0.0 - 0.1 K/uL  Comprehensive metabolic panel     Status: Abnormal   Collection Time: 10/22/14  7:21 PM  Result Value Ref Range   Sodium 132 (L) 135 - 145 mmol/L    Comment: Please note change in reference range.   Potassium 5.1 3.5 - 5.1 mmol/L    Comment: Please note change in reference range.   Chloride 92 (L) 96 - 112 mEq/L   CO2 20 19 - 32 mmol/L   Glucose, Bld 357 (H) 70 - 99 mg/dL   BUN 65 (H) 6 - 23 mg/dL   Creatinine, Ser 12.89 (H) 0.50 - 1.35 mg/dL   Calcium 7.6 (L) 8.4 - 10.5 mg/dL   Total Protein 6.4 6.0 - 8.3 g/dL   Albumin 2.8 (L) 3.5 - 5.2 g/dL   AST 21 0 - 37 U/L   ALT 11 0 - 53 U/L   Alkaline Phosphatase 118 (H) 39 - 117 U/L   Total Bilirubin 0.4 0.3 - 1.2 mg/dL   GFR calc non Af Amer 3 (L) >90 mL/min   GFR calc Af Amer 4 (L) >90 mL/min    Comment: (NOTE) The eGFR has been calculated using the CKD EPI equation. This calculation has not been validated in all clinical situations. eGFR's persistently <90 mL/min signify possible Chronic Kidney Disease.    Anion gap 20 (H) 5 - 15  I-Stat Troponin, ED (not at [facility])     Status: None   Collection Time: 10/22/14  7:30 PM  Result Value Ref Range   Troponin i, poc 0.00 0.00 - 0.08 ng/mL   Comment 3            Comment: Due to the release kinetics of cTnI, a negative result within the first hours of the onset of symptoms does  not rule out myocardial infarction with certainty. If myocardial infarction is still suspected, repeat the test at appropriate intervals.   Prepare RBC     Status: None   Collection Time: 10/22/14 11:00 PM  Result Value Ref Range   Order Confirmation ORDER PROCESSED BY BLOOD BANK   Type and screen     Status: None (Preliminary result)   Collection Time: 10/22/14 11:17 PM  Result Value Ref Range   ABO/RH(D) O POS    Antibody Screen NEG    Sample Expiration 10/25/2014    Unit Number [id number]    Blood Component Type RED CELLS,LR    Unit division 00    Status of Unit ISSUED    Transfusion Status OK TO TRANSFUSE    Crossmatch Result Compatible   Glucose, capillary     Status: Abnormal   Collection Time: 10/23/14  1:21 AM  Result Value Ref Range   Glucose-Capillary 327 (H) 70 - 99 mg/dL  Comprehensive metabolic panel     Status: Abnormal   Collection Time: 10/23/14  4:39 AM  Result Value Ref Range   Sodium 134 (L) 135 - 145 mmol/L    Comment: Please note change in reference range.  Potassium 4.4 3.5 - 5.1 mmol/L    Comment: Please note change in reference range.   Chloride 95 (L) 96 - 112 mEq/L   CO2 23 19 - 32 mmol/L   Glucose, Bld 205 (H) 70 - 99 mg/dL   BUN 66 (H) 6 - 23 mg/dL   Creatinine, Ser 13.27 (H) 0.50 - 1.35 mg/dL   Calcium 7.6 (L) 8.4 - 10.5 mg/dL   Total Protein 6.0 6.0 - 8.3 g/dL   Albumin 2.6 (L) 3.5 - 5.2 g/dL   AST 15 0 - 37 U/L   ALT 10 0 - 53 U/L   Alkaline Phosphatase 128 (H) 39 - 117 U/L   Total Bilirubin 0.5 0.3 - 1.2 mg/dL   GFR calc non Af Amer 3 (L) >90 mL/min   GFR calc Af Amer 4 (L) >90 mL/min    Comment: (NOTE) The eGFR has been calculated using the CKD EPI equation. This calculation has not been validated in all clinical situations. eGFR's persistently <90 mL/min signify possible Chronic Kidney Disease.    Anion gap 16 (H) 5 - 15  CBC     Status: Abnormal   Collection Time: 10/23/14  4:39 AM  Result Value Ref Range   WBC 4.8 4.0 -  10.5 K/uL   RBC 3.02 (L) 4.22 - 5.81 MIL/uL   Hemoglobin 8.6 (L) 13.0 - 17.0 g/dL   HCT 27.3 (L) 39.0 - 52.0 %   MCV 90.4 78.0 - 100.0 fL   MCH 28.5 26.0 - 34.0 pg   MCHC 31.5 30.0 - 36.0 g/dL   RDW 18.6 (H) 11.5 - 15.5 %   Platelets 83 (L) 150 - 400 K/uL    Comment: CONSISTENT WITH PREVIOUS RESULT  Protime-INR     Status: None   Collection Time: 10/23/14  4:39 AM  Result Value Ref Range   Prothrombin Time 13.8 11.6 - 15.2 seconds   INR 1.05 0.00 - 1.49  Glucose, capillary     Status: Abnormal   Collection Time: 10/23/14  5:58 AM  Result Value Ref Range   Glucose-Capillary 131 (H) 70 - 99 mg/dL   Constitutional: negative for chills, fatigue, fevers and sweats Ears, nose, mouth, throat, and face: negative for earaches, hoarseness, nasal congestion and sore throat Respiratory: negative for cough, dyspnea on exertion, hemoptysis and sputum Cardiovascular: negative for chest pain, chest pressure/discomfort, dyspnea, orthopnea and palpitations Gastrointestinal: negative for abdominal pain, change in bowel habits, nausea and vomiting Genitourinary:oliguric Musculoskeletal:negative for arthralgias, back pain, myalgias and neck pain Neurological: negative for dizziness, headaches, speech problems and weakness  Physical Exam: Filed Vitals:   10/23/14 0920  BP: 119/60  Pulse: 76  Temp: 97.8 F (36.6 C)  Resp: 18     General appearance: alert, cooperative and no distress Head: Normocephalic, without obvious abnormality, atraumatic Neck: no adenopathy, no carotid bruit, no JVD and supple, symmetrical, trachea midline Resp: clear to auscultation bilaterally Cardio: regular rate and rhythm, S1, S2 normal, no murmur, click, rub or gallop GI: soft, non-tender; bowel sounds normal; no masses,  no organomegaly Extremities: Trace-1+ LE edema, well-healed L great toe amputation Neurologic: Grossly normal Dialysis Access: AVF @ [facility] with + bruit   Assessment/Plan: 1. Anemia - Hx gastric  AVMs, requiring transfusions; Hgb up to 8.6 from 7.2 s/p 1 U PRBCs today, Aranesp 200 mcg & IV Fe qwk. 2. ESRD - HD on TTS @ [place], K 4.4.  HD tomorrow per holiday schedule.  3. Hypertension/volume - BP 119/60, no meds; wt  95 kg, up 3 L with HD tomorrow. 4. Metabolic bone disease - Ca 7.6 (8.7 corrected), last P 7, iPTH 255; Phoslo 3 with meals. 5. Nutrition - Alb 2.6, clear liquids, vitamin. 6. HIV - on antivirals 7. DM Type 2 - per primary 8. PVD - s/p L 1st toe amputation   Jeremi Losito 10/23/2014, 10:41 AM   Attending Nephrologist: Elmarie Shiley, MD

## 2014-10-23 NOTE — Progress Notes (Signed)
Received report from ED RN. Room ready.

## 2014-10-23 NOTE — Consult Note (Signed)
Referring Provider: Dr. Tana Coast Primary Care Physician:  Maggie Font, MD Primary Gastroenterologist:  Althia Forts  Reason for Consultation:  Anemia; Heme positive stool  HPI: Benjamin Long is a 68 y.o. 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 Since that procedure he has felt very weak and had a Hgb 7.2 with heme positive stools in the ER on 10/21/14 and admission was recommended but he chose to go home and was given 1 U PRBCs and sent home. He returned last night due to continued weakness and was transfused another unit of blood. Loose nonbloody stools yesterday as well. Hgb 8.6 today post-transfusion and it was still 7.2 last evening when he returned. He denies melena, hematochezia, or rectal bleeding recently or in the past and states that he has had problems with AVMs for 3 years but denies ever seeing any rectal bleeding. Patient also reportedly had a colonoscopy this month at the [place] where one colon polyp was removed and no active bleeding was seen. EGD showed "20 bleeding AVMs that were burned" in his stomach. Has HIV and reports a normal CD4 count. On hemodialysis TTS.     Past Medical History  Diagnosis Date  . ESRD on peritoneal dialysis   . HIV disease   . Hypertension   . Shortness of breath   . DM type 2 causing renal disease   . Neuromuscular disorder     tremers  . Cancer     hx of rectal cancer  . Anemia 09/02/2012  . Hepatitis     hx of hepatitis B  . Iron (Fe) deficiency anemia 09/02/2012  . Peripheral vascular disease   . Osteomyelitis     Past Surgical History  Procedure Laterality Date  . Hemorroidectomy    . Insertion of dialysis catheter    . Hernia repair      TESTICLE  . Amputation of big toe Left april 2015    Prior to Admission medications   Medication Sig Start Date End Date Taking? Authorizing Provider  allopurinol (ZYLOPRIM) 100 MG tablet Take 100 mg by  mouth daily.    Historical Provider, MD  calcium acetate (PHOSLO) 667 MG capsule Take 1,334-2,668 mg by mouth See admin instructions. Takes 3 capsules with meals and 2 capsules with snacks    Historical Provider, MD  camphor-menthol (SARNA) lotion Apply 1 application topically 3 (three) times daily as needed for itching.    Historical Provider, MD  capsicum (CAPSAICIN APR) 0.075 % topical cream Apply 1 application topically daily as needed (pain).    Historical Provider, MD  carvedilol (COREG) 3.125 MG tablet Take 3.125 mg by mouth See admin instructions. Take twice daily on non HD days, Take once daily on HD days    Historical Provider, MD  fosamprenavir (LEXIVA) 700 MG tablet Take 700 mg by mouth 2 (two) times daily with a meal. 09/05/12   Eugenie Filler, MD  gabapentin (NEURONTIN) 300 MG capsule Take 300-600 mg by mouth 2 (two) times daily. 1 cap in am and 2 caps in pm    Historical Provider, MD  insulin glargine (LANTUS) 100 UNIT/ML injection Inject 2-15 Units into the skin every morning. Per sliding scale    Historical Provider, MD  insulin regular (NOVOLIN R,HUMULIN R) 100 units/mL injection Inject 2-14 Units into the skin 3 (three) times daily before meals. Pt takes anywhere from 6-14 units as directed per sliding scale  Historical Provider, MD  latanoprost (XALATAN) 0.005 % ophthalmic solution Place 1 drop into both eyes at bedtime.    Historical Provider, MD  nortriptyline (PAMELOR) 25 MG capsule Take 50 mg by mouth at bedtime.    Historical Provider, MD  oxyCODONE (OXY IR/ROXICODONE) 5 MG immediate release tablet Take 10 mg by mouth every 4 (four) hours.    Historical Provider, MD  pantoprazole (PROTONIX) 40 MG tablet Take 40 mg by mouth 2 (two) times daily.    Historical Provider, MD  polyvinyl alcohol (LIQUIFILM TEARS) 1.4 % ophthalmic solution Place 1 drop into both eyes 4 (four) times daily.    Historical Provider, MD  pravastatin (PRAVACHOL) 40 MG tablet Take 20 mg by mouth every  evening.    Historical Provider, MD  raltegravir (ISENTRESS) 400 MG tablet Take 400 mg by mouth 2 (two) times daily.    Historical Provider, MD  ritonavir (NORVIR) 100 MG capsule Take 100 mg by mouth 2 (two) times daily.    Historical Provider, MD  sildenafil (VIAGRA) 50 MG tablet Take 50 mg by mouth daily as needed. For erectile dysfunction    Historical Provider, MD  simethicone (MYLICON) 80 MG chewable tablet Chew 240 mg by mouth 4 (four) times daily as needed for flatulence.    Historical Provider, MD  sodium polystyrene (KAYEXALATE) powder Take by mouth once. Take 50g by mouth tomorrow. 10/21/14   Ernestina Patches, MD  tenofovir (VIREAD) 300 MG tablet Take 300 mg by mouth every 7 (seven) days. Take on Tuesdays after hemodialysis.    Historical Provider, MD  urea (CARMOL) 20 % cream Apply 1 application topically 2 (two) times daily as needed (dry feet).    Historical Provider, MD    Scheduled Meds: . allopurinol  100 mg Oral Daily  . calcium acetate  2,001 mg Oral TID WC  . fosamprenavir  700 mg Oral BID WC  . gabapentin  300 mg Oral Daily   And  . gabapentin  600 mg Oral QHS  . insulin aspart  0-15 Units Subcutaneous Q6H  . nortriptyline  50 mg Oral QHS  . oxyCODONE  10 mg Oral Q4H  . pantoprazole (PROTONIX) IV  40 mg Intravenous Q12H  . pravastatin  20 mg Oral QPM  . raltegravir  400 mg Oral BID  . ritonavir  100 mg Oral BID WC  . sodium chloride  3 mL Intravenous Q12H  . [START ON 10/26/2014] tenofovir  300 mg Oral Q Tue-1800   Continuous Infusions:  PRN Meds:.acetaminophen **OR** acetaminophen, calcium acetate, ondansetron **OR** ondansetron (ZOFRAN) IV, simethicone  Allergies as of 10/22/2014 - Review Complete 10/22/2014  Allergen Reaction Noted  . Codeine Nausea And Vomiting and Nausea Only 12/26/2011    Family History  Problem Relation Age of Onset  . Diabetes Mother   . Cancer Mother     gallbladder  . Heart disease Father   . Cancer Sister     gallbladder     History   Social History  . Marital Status: Married    Spouse Name: N/A    Number of Children: N/A  . Years of Education: N/A   Occupational History  . Not on file.   Social History Main Topics  . Smoking status: Former Smoker -- 0.25 packs/day for 50 years    Types: Cigarettes    Quit date: 01/08/2014  . Smokeless tobacco: Never Used  . Alcohol Use: No  . Drug Use: No  . Sexual Activity: Not on file  Other Topics Concern  . Not on file   Social History Narrative    Review of Systems: All negative from GI standpoint except as stated above in HPI.  Physical Exam: Vital signs: Filed Vitals:   10/23/14 0920  BP: 119/60  Pulse: 76  Temp: 97.8 F (36.6 C)  Resp: 18   Last BM Date: 10/22/14 General:   Alert,  Well-developed, well-nourished, pleasant and cooperative in NAD HEENT: anicteric Neck: supple, nontender Lungs:  Clear throughout to auscultation.   No wheezes, crackles, or rhonchi. No acute distress. Heart:  Regular rate and rhythm; no murmurs, clicks, rubs,  or gallops. Abdomen: soft, nontender, nondistended, +BS  Rectal:  Deferred Ext: no edema  GI:  Lab Results:  Recent Labs  10/21/14 1300 10/22/14 1921 10/23/14 0439  WBC 4.3 5.4 4.8  HGB 7.2* 7.2* 8.6*  HCT 24.4* 23.8* 27.3*  PLT 77* 86* 83*   BMET  Recent Labs  10/21/14 1300 10/22/14 1921 10/23/14 0439  NA 133* 132* 134*  K 5.8* 5.1 4.4  CL 95* 92* 95*  CO2 22 20 23   GLUCOSE 299* 357* 205*  BUN 57* 65* 66*  CREATININE 11.18* 12.89* 13.27*  CALCIUM 8.3* 7.6* 7.6*   LFT  Recent Labs  10/23/14 0439  PROT 6.0  ALBUMIN 2.6*  AST 15  ALT 10  ALKPHOS 128*  BILITOT 0.5   PT/INR  Recent Labs  10/23/14 0439  LABPROT 13.8  INR 1.05     Studies/Results: Dg Chest 2 View  10/22/2014   CLINICAL DATA:  Chest pain and short of breath.  EXAM: CHEST  2 VIEW  COMPARISON:  08/26/2014  FINDINGS: Normal heart size. Calcified granulomata in the left upper lobe. Otherwise clear  lungs. No pneumothorax. No pleural effusion. Stable skeleton.  IMPRESSION: No active cardiopulmonary disease.   Electronically Signed   By: Maryclare Bean M.D.   On: 10/22/2014 20:44    Impression/Plan:  21 with ESRD, HIV on antiretrovirals presenting with obscure occult GI bleeding likely due to AVMs. No evidence of an active GI bleed. If Hgb stable tomorrow (then ok to go home from GI standpoint), and repeat EGD as outpt at the [place] in January but if Hgb continues to fall then will need EGD with argon plasma coagulation of AVMs on Monday. Keep on clear liquids today. Will follow.   LOS: 1 day   McNeil C.  10/23/2014, 1:56 PM

## 2014-10-23 NOTE — Progress Notes (Signed)
Patient ID: Benjamin Long  male  NWG:[id number]    DOB: [birth year]    DOA: 10/22/2014  PCP: Maggie Font, MD  Brief history of present illness Patient is a 68 year old male with ESRD on hemodialysis, hypertension, diabetes, recurrent GI bleeding, PVD, presented with fatigue, generalized weakness. Patient was seen in ED on 12/24 for the same, was found to be anemic with guaiac positive stool. Patient got home and had 6-7 bowel movements which were watery but no hematochezia or melena per patient. He had received Kayexalate while he was in the hospital.  Assessment/Plan: Principal Problem:   Symptomatic anemia -  FOBT + 12/24,  hemoglobin 7.2 on admission 12/25 - Transfused 1 unit packed RBC, hemoglobin 8.6 today - Called GI consult Sadie Haber), patient reports prior GI workup at [facility], had endoscopies and small bowel studies through in 2014 which showed AVM's which were cauterized    Active Problems:   HIV disease -Continue home medications     Hypertension - Currently stable    DM type 2 causing renal disease - Continue sliding scale insulin  ESRD on HD TTS - Nephrology consulted, hemodialysis today per his schedule  Chronic thrombocytopenia - Monitor counts closely  Diabetic neuropathy - Continue oxycodone, pamelor  DVT Prophylaxis:SCDs  Code Status:  Family Communication:  Disposition:  Consultants: Nephrology  Gastroenterology   Procedures:  HD  Antibiotics:  None    Subjective:  Patient seen and examined, no active bleeding, no chest pain or shortness of breath, fevers or chills, chronic neuropathy pain in legs and feet  Objective: Weight change:   Intake/Output Summary (Last 24 hours) at 10/23/14 1128 Last data filed at 10/23/14 0900  Gross per 24 hour  Intake    603 ml  Output      0 ml  Net    603 ml   Blood pressure 119/60, pulse 76, temperature 97.8 F (36.6 C), temperature source Oral, resp. rate 18, height 5\' 11"  (1.803 m), weight  95.029 kg (209 lb 8 oz), SpO2 96 %.  Physical Exam: General: Alert and awake, oriented x3, not in any acute distress. CVS: S1-S2 clear, no murmur rubs or gallops Chest: clear to auscultation bilaterally, no wheezing, rales or rhonchi Abdomen: soft nontender, nondistended, normal bowel sounds  Extremities: no cyanosis, clubbing or edema noted bilaterally Neuro: Cranial nerves II-XII intact, no focal neurological deficits  Lab Results: Basic Metabolic Panel:  Recent Labs Lab 10/22/14 1921 10/23/14 0439  NA 132* 134*  K 5.1 4.4  CL 92* 95*  CO2 20 23  GLUCOSE 357* 205*  BUN 65* 66*  CREATININE 12.89* 13.27*  CALCIUM 7.6* 7.6*   Liver Function Tests:  Recent Labs Lab 10/22/14 1921 10/23/14 0439  AST 21 15  ALT 11 10  ALKPHOS 118* 128*  BILITOT 0.4 0.5  PROT 6.4 6.0  ALBUMIN 2.8* 2.6*   No results for input(s): LIPASE, AMYLASE in the last 168 hours. No results for input(s): AMMONIA in the last 168 hours. CBC:  Recent Labs Lab 10/22/14 1921 10/23/14 0439  WBC 5.4 4.8  NEUTROABS 3.4  --   HGB 7.2* 8.6*  HCT 23.8* 27.3*  MCV 94.4 90.4  PLT 86* 83*   Cardiac Enzymes: No results for input(s): CKTOTAL, CKMB, CKMBINDEX, TROPONINI in the last 168 hours. BNP: Invalid input(s): POCBNP CBG:  Recent Labs Lab 10/23/14 0121 10/23/14 0558  GLUCAP 327* 131*     Micro Results: No results found for this or any previous visit (from the past 240  hour(s)).  Studies/Results: Dg Chest 2 View  10/22/2014   CLINICAL DATA:  Chest pain and short of breath.  EXAM: CHEST  2 VIEW  COMPARISON:  08/26/2014  FINDINGS: Normal heart size. Calcified granulomata in the left upper lobe. Otherwise clear lungs. No pneumothorax. No pleural effusion. Stable skeleton.  IMPRESSION: No active cardiopulmonary disease.   Electronically Signed   By: Maryclare Bean M.D.   On: 10/22/2014 20:44    Medications: Scheduled Meds: . allopurinol  100 mg Oral Daily  . calcium acetate  2,001 mg Oral TID  WC  . fosamprenavir  700 mg Oral BID WC  . gabapentin  300 mg Oral Daily   And  . gabapentin  600 mg Oral QHS  . insulin aspart  0-15 Units Subcutaneous Q6H  . nortriptyline  50 mg Oral QHS  . oxyCODONE  10 mg Oral Q4H  . pantoprazole (PROTONIX) IV  40 mg Intravenous Q12H  . pravastatin  20 mg Oral QPM  . raltegravir  400 mg Oral BID  . ritonavir  100 mg Oral BID WC  . sodium chloride  3 mL Intravenous Q12H  . [START ON 10/26/2014] tenofovir  300 mg Oral Q Tue-1800      LOS: 1 day   Merril Nagy M.D. [facility] 10/23/2014, 11:28 AM Pager: [phone]  If 7PM-7AM, please contact night-coverage [email or web address] Password [email or web address]

## 2014-10-24 LAB — TYPE AND SCREEN
ABO/RH(D): O POS
Antibody Screen: NEGATIVE
Unit division: 0

## 2014-10-24 LAB — CBC
HCT: 29 % — ABNORMAL LOW (ref 39.0–52.0)
Hemoglobin: 9.4 g/dL — ABNORMAL LOW (ref 13.0–17.0)
MCH: 29.7 pg (ref 26.0–34.0)
MCHC: 32.4 g/dL (ref 30.0–36.0)
MCV: 91.5 fL (ref 78.0–100.0)
PLATELETS: 108 10*3/uL — AB (ref 150–400)
RBC: 3.17 MIL/uL — ABNORMAL LOW (ref 4.22–5.81)
RDW: 18.7 % — ABNORMAL HIGH (ref 11.5–15.5)
WBC: 7.8 10*3/uL (ref 4.0–10.5)

## 2014-10-24 LAB — RENAL FUNCTION PANEL
ANION GAP: 21 — AB (ref 5–15)
Albumin: 2.9 g/dL — ABNORMAL LOW (ref 3.5–5.2)
BUN: 69 mg/dL — ABNORMAL HIGH (ref 6–23)
CALCIUM: 8.7 mg/dL (ref 8.4–10.5)
CO2: 18 mmol/L — AB (ref 19–32)
Chloride: 90 mEq/L — ABNORMAL LOW (ref 96–112)
Creatinine, Ser: 13.69 mg/dL — ABNORMAL HIGH (ref 0.50–1.35)
GFR, EST AFRICAN AMERICAN: 4 mL/min — AB (ref >90)
GFR, EST NON AFRICAN AMERICAN: 3 mL/min — AB (ref >90)
Glucose, Bld: 132 mg/dL — ABNORMAL HIGH (ref 70–99)
PHOSPHORUS: 10.5 mg/dL — AB (ref 2.3–4.6)
POTASSIUM: 5.2 mmol/L — AB (ref 3.5–5.1)
SODIUM: 129 mmol/L — AB (ref 135–145)

## 2014-10-24 LAB — GLUCOSE, CAPILLARY
Glucose-Capillary: 145 mg/dL — ABNORMAL HIGH (ref 70–99)
Glucose-Capillary: 133 mg/dL — ABNORMAL HIGH (ref 70–99)

## 2014-10-24 MED ORDER — SODIUM CHLORIDE 0.9 % IV SOLN: 100.0000 mL | INTRAVENOUS | Status: DC | PRN

## 2014-10-24 MED ORDER — PENTAFLUOROPROP-TETRAFLUOROETH EX AERO: 1.0000 "application " | INHALATION_SPRAY | CUTANEOUS | Status: DC | PRN

## 2014-10-24 MED ORDER — LIDOCAINE HCL (PF) 1 % IJ SOLN: 5.0000 mL | INTRAMUSCULAR | Status: DC | PRN

## 2014-10-24 MED ORDER — FOSAMPRENAVIR CALCIUM 700 MG PO TABS: 700.0000 mg | ORAL_TABLET | Freq: Two times a day (BID) | ORAL | Status: AC

## 2014-10-24 MED ORDER — NEPRO/CARBSTEADY PO LIQD: 237.0000 mL | ORAL | Status: DC | PRN

## 2014-10-24 MED ORDER — LIDOCAINE-PRILOCAINE 2.5-2.5 % EX CREA: 1.0000 "application " | TOPICAL_CREAM | CUTANEOUS | Status: DC | PRN

## 2014-10-24 MED ORDER — HEPARIN SODIUM (PORCINE) 1000 UNIT/ML DIALYSIS: 1000.0000 [IU] | INTRAMUSCULAR | Status: DC | PRN

## 2014-10-24 MED ORDER — ALTEPLASE 2 MG IJ SOLR: 2.0000 mg | Freq: Once | INTRAMUSCULAR | Status: DC | PRN

## 2014-10-24 NOTE — Procedures (Signed)
Patient seen on Hemodialysis. QB 350, UF goal 3.5L Treatment adjusted as needed.  Elmarie Shiley MD [facility]. Office # [phone] Pager # [phone] 10:30 AM

## 2014-10-24 NOTE — Discharge Summary (Signed)
Physician Discharge Summary  Schneur Crowson UEK:[id number] DOB: 12-Mar-1946 DOA: 10/22/2014  PCP: Maggie Font, MD  Admit date: 10/22/2014 Discharge date: 10/24/2014  Time spent: 35 minutes  Recommendations for Outpatient Follow-up:  1. [facility]  Discharge Diagnoses:  Principal Problem:   Symptomatic anemia Active Problems:   HIV disease   Hypertension   DM type 2 causing renal disease   Hepatitis   ESRD (end stage renal disease)   GI bleed   Claudication   Discharge Condition: heart healthy  Diet recommendation: stable  Filed Weights   10/23/14 0125 10/23/14 2123 10/24/14 0805  Weight: 95.029 kg (209 lb 8 oz) 97.9 kg (215 lb 13.3 oz) 96.9 kg (213 lb 10 oz)    History of present illness:  68 y.o. male with Past medical history of ESRD on hemodialysis, HIV, hypertension, hepatitis, diabetes mellitus, recurrent GI bleeding, peripheral vascular disease. The patient is presenting with complaints of fatigue and tiredness. Patient was seen in the ER yesterday for the complaint of fatigue and tiredness at which time he was found to be anemic with guaiac positive stool. Patient was recommended hospital admission but preferred to be discharged home. After discharging home patient had 6-7 bowel movements which are loose and watery without any blood. He denied any black color bowel movement  Hospital Course:  Symptomatic anemia - FOBT + 12/24 on admission, hemoglobin 7.2 on admission 12/24, Transfused 1 unit packed RBC, hemoglobin 8.6. - GI consult Sadie Haber), who recommended obeservation. Pt had no black stool and GI recommended follow up with GI at [facility]. - patient reports prior GI workup at [facility], had endoscopies and small bowel studies through in 2014 which showed AVM's which were cauterized   HIV disease -Continue home medications  Hypertension - Currently stable  DM type 2 causing renal disease - Continue sliding scale insulin  ESRD  on HD TTS - Nephrology consulted, hemodialysis today per his schedule  Chronic thrombocytopenia - Monitor counts closely  Diabetic neuropathy - Continue oxycodone, pamelor  Procedures:  CXR  Consultations:  Nephrology  GI  Discharge Exam: Filed Vitals:   10/24/14 1200  BP: 94/61  Pulse: 82  Temp:   Resp:     General: A&O x3 Cardiovascular: RRR Respiratory: good air movement CT B/L  Discharge Instructions   Discharge Instructions    Diet - low sodium heart healthy    Complete by:  As directed      Increase activity slowly    Complete by:  As directed           Current Discharge Medication List    CONTINUE these medications which have CHANGED   Details  fosamprenavir (LEXIVA) 700 MG tablet Take 1 tablet (700 mg total) by mouth 2 (two) times daily with a meal.      CONTINUE these medications which have NOT CHANGED   Details  sodium polystyrene (KAYEXALATE) 15 GM/60ML suspension Take 15 g by mouth once.    allopurinol (ZYLOPRIM) 100 MG tablet Take 100 mg by mouth daily.    calcium acetate (PHOSLO) 667 MG capsule Take 1,334-2,668 mg by mouth See admin instructions. Takes 3 capsules with meals and 2 capsules with snacks    camphor-menthol (SARNA) lotion Apply 1 application topically 3 (three) times daily as needed for itching.    capsicum (CAPSAICIN APR) 0.075 % topical cream Apply 1 application topically daily as needed (pain).    carvedilol (COREG) 3.125 MG tablet Take 3.125 mg by mouth  See admin instructions. Take twice daily on non HD days, Take once daily on HD days    gabapentin (NEURONTIN) 300 MG capsule Take 300-600 mg by mouth 2 (two) times daily. 1 cap in am and 2 caps in pm    insulin glargine (LANTUS) 100 UNIT/ML injection Inject 2-15 Units into the skin every morning. Per sliding scale    insulin regular (NOVOLIN R,HUMULIN R) 100 units/mL injection Inject 2-14 Units into the skin 3 (three) times daily before meals. Pt takes anywhere from 6-14  units as directed per sliding scale    latanoprost (XALATAN) 0.005 % ophthalmic solution Place 1 drop into both eyes at bedtime.    nortriptyline (PAMELOR) 25 MG capsule Take 50 mg by mouth at bedtime.    Oxycodone HCl 10 MG TABS Take 10 mg by mouth every 12 (twelve) hours as needed. for pain Refills: 0    pantoprazole (PROTONIX) 40 MG tablet Take 40 mg by mouth 2 (two) times daily.    polyvinyl alcohol (LIQUIFILM TEARS) 1.4 % ophthalmic solution Place 1 drop into both eyes 4 (four) times daily.    pravastatin (PRAVACHOL) 40 MG tablet Take 20 mg by mouth every evening.    raltegravir (ISENTRESS) 400 MG tablet Take 400 mg by mouth 2 (two) times daily.    ritonavir (NORVIR) 100 MG capsule Take 100 mg by mouth 2 (two) times daily.    sildenafil (VIAGRA) 50 MG tablet Take 50 mg by mouth daily as needed. For erectile dysfunction    simethicone (MYLICON) 80 MG chewable tablet Chew 240 mg by mouth 4 (four) times daily as needed for flatulence.    tenofovir (VIREAD) 300 MG tablet Take 300 mg by mouth every 7 (seven) days. Take on Tuesdays after hemodialysis.    urea (CARMOL) 20 % cream Apply 1 application topically 2 (two) times daily as needed (dry feet).      STOP taking these medications     HYDROcodone-acetaminophen (NORCO) 7.5-325 MG per tablet      sodium polystyrene (KAYEXALATE) powder        Allergies  Allergen Reactions  . Codeine Nausea And Vomiting and Nausea Only      The results of significant diagnostics from this hospitalization (including imaging, microbiology, ancillary and laboratory) are listed below for reference.    Significant Diagnostic Studies: Dg Chest 2 View  10/22/2014   CLINICAL DATA:  Chest pain and short of breath.  EXAM: CHEST  2 VIEW  COMPARISON:  08/26/2014  FINDINGS: Normal heart size. Calcified granulomata in the left upper lobe. Otherwise clear lungs. No pneumothorax. No pleural effusion. Stable skeleton.  IMPRESSION: No active  cardiopulmonary disease.   Electronically Signed   By: Maryclare Bean M.D.   On: 10/22/2014 20:44    Microbiology: No results found for this or any previous visit (from the past 240 hour(s)).   Labs: Basic Metabolic Panel:  Recent Labs Lab 10/21/14 1300 10/22/14 1921 10/23/14 0439 10/24/14 0800  NA 133* 132* 134* 129*  K 5.8* 5.1 4.4 5.2*  CL 95* 92* 95* 90*  CO2 22 20 23  18*  GLUCOSE 299* 357* 205* 132*  BUN 57* 65* 66* 69*  CREATININE 11.18* 12.89* 13.27* 13.69*  CALCIUM 8.3* 7.6* 7.6* 8.7  PHOS  --   --   --  10.5*   Liver Function Tests:  Recent Labs Lab 10/22/14 1921 10/23/14 0439 10/24/14 0800  AST 21 15  --   ALT 11 10  --   ALKPHOS 118*  128*  --   BILITOT 0.4 0.5  --   PROT 6.4 6.0  --   ALBUMIN 2.8* 2.6* 2.9*   No results for input(s): LIPASE, AMYLASE in the last 168 hours. No results for input(s): AMMONIA in the last 168 hours. CBC:  Recent Labs Lab 10/21/14 1300 10/22/14 1921 10/23/14 0439 10/24/14 0800  WBC 4.3 5.4 4.8 7.8  NEUTROABS 2.4 3.4  --   --   HGB 7.2* 7.2* 8.6* 9.4*  HCT 24.4* 23.8* 27.3* 29.0*  MCV 96.8 94.4 90.4 91.5  PLT 77* 86* 83* 108*   Cardiac Enzymes: No results for input(s): CKTOTAL, CKMB, CKMBINDEX, TROPONINI in the last 168 hours. BNP: BNP (last 3 results) No results for input(s): PROBNP in the last 8760 hours. CBG:  Recent Labs Lab 10/23/14 0558 10/23/14 1137 10/23/14 1746 10/23/14 2356 10/24/14 0742  GLUCAP 131* 137* 169* 134* 145*       Signed:  Charlynne Cousins  [facility] 10/24/2014, 12:25 PM

## 2014-10-24 NOTE — Progress Notes (Signed)
Subjective:  No complaints, ambulating without problems  Objective: Vital signs in last 24 hours: Temp:  [97.7 F (36.5 C)-99 F (37.2 C)] 98.6 F (37 C) (12/27 0452) Pulse Rate:  [76-90] 81 (12/27 0452) Resp:  [15-18] 16 (12/27 0452) BP: (119-188)/(60-140) 138/65 mmHg (12/27 0452) SpO2:  [93 %-100 %] 96 % (12/27 0452) Weight:  [97.9 kg (215 lb 13.3 oz)] 97.9 kg (215 lb 13.3 oz) (12/26 2123) Weight change: 2.872 kg (6 lb 5.3 oz)  Intake/Output from previous day: 12/26 0701 - 12/27 0700 In: 720 [P.O.:720] Out: -  Intake/Output this shift:   Lab Results:  Recent Labs  10/22/14 1921 10/23/14 0439  WBC 5.4 4.8  HGB 7.2* 8.6*  HCT 23.8* 27.3*  PLT 86* 83*   BMET:  Recent Labs  10/22/14 1921 10/23/14 0439  NA 132* 134*  K 5.1 4.4  CL 92* 95*  CO2 20 23  GLUCOSE 357* 205*  BUN 65* 66*  CREATININE 12.89* 13.27*  CALCIUM 7.6* 7.6*  ALBUMIN 2.8* 2.6*   No results for input(s): PTH in the last 72 hours. Iron Studies:  Recent Labs  10/23/14 1340  IRON 81  TIBC 328  FERRITIN 123   Studies/Results: No results found.   EXAM: General appearance:  Alert, in no apparent distress Resp:  CTA without rales, rhonchi, or wheezes Cardio:  RRR without murmur or rub GI:  + BS, soft and nontender Extremities:  Trace edema bilaterally, well-healed L 1st toe amputation Access:  AVF @ LUA with + bruit  Dialysis Orders: TTS @ [place]  4 hrs 92 kg 2K/2.25Ca 400/800 No Heparin AVF @ LUA  No Hectorol Aranesp 200 mcg & Venofer 100 mg on Weds   Assessment/Plan: 1. Anemia - Hx gastric AVMs, requiring transfusions; Hgb up to 8.6 from 7.2 s/p 1 U PRBCs yesterday, Aranesp 200 mcg & IV Fe qwk. 2. ESRD - HD on TTS @ [place], K 4.4. HD today per holiday schedule.  3. HTN/volume - BP 128/65, no meds; wt 97.9 kg, mild LE edema with HD pending. 4. Metabolic bone disease - Ca 7.6 (8.7 corrected), last P 7, iPTH 255; Phoslo 3 with meals. 5. Nutrition  - Alb 2.6, clear liquids, vitamin. 6. HIV - on antivirals 7. DM Type 2 - per primary 8. PVD - s/p L 1st toe amputation   LOS: 2 days   Benjamin Long 10/24/2014,7:14 AM

## 2014-10-24 NOTE — Progress Notes (Signed)
Patient discharge teaching given, including activity, diet, follow-up appoints, and medications. Patient verbalized understanding of all discharge instructions. IV access was d/c'd. Vitals are stable. Skin is intact except as charted in most recent assessments.  Patient's home medications returned to patient.  Pt to be escorted out by NT, to be driven home by family.  Jillyn Ledger, MBA, BS, RN

## 2014-10-25 LAB — GLUCOSE, CAPILLARY: Glucose-Capillary: 125 mg/dL — ABNORMAL HIGH (ref 70–99)

## 2014-10-29 NOTE — ED Provider Notes (Signed)
CSN: [id number]     Arrival date & time 10/22/14  1849 History   First MD Initiated Contact with Patient 10/22/14 2021     Chief Complaint  Patient presents with  . Weakness  . Fatigue  . Shortness of Breath     (Consider location/radiation/quality/duration/timing/severity/associated sxs/prior Treatment) HPI   69 y.o. male with Past medical history of ESRD on hemodialysis, HIV, hypertension, hepatitis, diabetes mellitus, recurrent GI bleeding, peripheral vascular disease. The patient is presenting with complaints of fatigue and tiredness. Patient was seen in the ER yesterday for the complaint of fatigue and tiredness at which time he was found to be anemic with guaiac positive stool. Patient was recommended hospital admission but preferred to be discharged home. After discharging home patient had 6-7 bowel movements which are loose and watery without any blood. He denied any black color bowel movement. He was given Kayexalate while she was here in the hospital 50 g. Throughout the day he was feeling fatigue and tired and was having some shortness of breath on exertion and therefore he decided to come to the hospital again. He mentions this other symptoms that he gets whenever he is anemic. He denies any active bleeding. He denies any cough chest pain dizziness lightheadedness fever or chills. He complains of severe pain on his hip on both that yesterday when he was walking in the pharmacy store which is resolved at present. He mentions he has a history of peripheral vascular disease and has recently evaluated with an angiogram in [place] in the last 1 month. Patient was recently hospitalized for symptomatic GI bleed and was recommended [facility]. he mentions he has undergone colonoscopy followed by upper endoscopy. Colonoscopy was showing only 1 polyp without any active bleeding. The upper endoscopy for the patient was showing 20 bleeding spot in his stomach and he  mentions they "burned" them. No other changes in medications reported   Past Medical History  Diagnosis Date  . ESRD on peritoneal dialysis   . HIV disease   . Hypertension   . Shortness of breath   . DM type 2 causing renal disease   . Neuromuscular disorder     tremers  . Cancer     hx of rectal cancer  . Anemia 09/02/2012  . Hepatitis     hx of hepatitis B  . Iron (Fe) deficiency anemia 09/02/2012  . Peripheral vascular disease   . Osteomyelitis    Past Surgical History  Procedure Laterality Date  . Hemorroidectomy    . Insertion of dialysis catheter    . Hernia repair      TESTICLE  . Amputation of big toe Left april 2015   Family History  Problem Relation Age of Onset  . Diabetes Mother   . Cancer Mother     gallbladder  . Heart disease Father   . Cancer Sister     gallbladder   History  Substance Use Topics  . Smoking status: Former Smoker -- 0.25 packs/day for 50 years    Types: Cigarettes    Quit date: 01/08/2014  . Smokeless tobacco: Never Used  . Alcohol Use: No    Review of Systems  All systems reviewed and negative, other than as noted in HPI.   Allergies  Codeine  Home Medications   Prior to Admission medications   Medication Sig Start Date End Date Taking? Authorizing Provider  sodium polystyrene (KAYEXALATE) 15 GM/60ML suspension Take 15 g by mouth once. 10/21/14  Yes Historical Provider, MD  allopurinol (ZYLOPRIM) 100 MG tablet Take 100 mg by mouth daily.    Historical Provider, MD  calcium acetate (PHOSLO) 667 MG capsule Take 1,334-2,668 mg by mouth See admin instructions. Takes 3 capsules with meals and 2 capsules with snacks    Historical Provider, MD  camphor-menthol (SARNA) lotion Apply 1 application topically 3 (three) times daily as needed for itching.    Historical Provider, MD  capsicum (CAPSAICIN APR) 0.075 % topical cream Apply 1 application topically daily as needed (pain).    Historical Provider, MD  carvedilol (COREG) 3.125  MG tablet Take 3.125 mg by mouth See admin instructions. Take twice daily on non HD days, Take once daily on HD days    Historical Provider, MD  fosamprenavir (LEXIVA) 700 MG tablet Take 1 tablet (700 mg total) by mouth 2 (two) times daily with a meal. 10/24/14   Charlynne Cousins, MD  gabapentin (NEURONTIN) 300 MG capsule Take 300-600 mg by mouth 2 (two) times daily. 1 cap in am and 2 caps in pm    Historical Provider, MD  insulin glargine (LANTUS) 100 UNIT/ML injection Inject 2-15 Units into the skin every morning. Per sliding scale    Historical Provider, MD  insulin regular (NOVOLIN R,HUMULIN R) 100 units/mL injection Inject 2-14 Units into the skin 3 (three) times daily before meals. Pt takes anywhere from 6-14 units as directed per sliding scale    Historical Provider, MD  latanoprost (XALATAN) 0.005 % ophthalmic solution Place 1 drop into both eyes at bedtime.    Historical Provider, MD  nortriptyline (PAMELOR) 25 MG capsule Take 50 mg by mouth at bedtime.    Historical Provider, MD  Oxycodone HCl 10 MG TABS Take 10 mg by mouth every 12 (twelve) hours as needed. for pain 09/03/14   Historical Provider, MD  pantoprazole (PROTONIX) 40 MG tablet Take 40 mg by mouth 2 (two) times daily.    Historical Provider, MD  polyvinyl alcohol (LIQUIFILM TEARS) 1.4 % ophthalmic solution Place 1 drop into both eyes 4 (four) times daily.    Historical Provider, MD  pravastatin (PRAVACHOL) 40 MG tablet Take 20 mg by mouth every evening.    Historical Provider, MD  raltegravir (ISENTRESS) 400 MG tablet Take 400 mg by mouth 2 (two) times daily.    Historical Provider, MD  ritonavir (NORVIR) 100 MG capsule Take 100 mg by mouth 2 (two) times daily.    Historical Provider, MD  sildenafil (VIAGRA) 50 MG tablet Take 50 mg by mouth daily as needed. For erectile dysfunction    Historical Provider, MD  simethicone (MYLICON) 80 MG chewable tablet Chew 240 mg by mouth 4 (four) times daily as needed for flatulence.     Historical Provider, MD  tenofovir (VIREAD) 300 MG tablet Take 300 mg by mouth every 7 (seven) days. Take on Tuesdays after hemodialysis.    Historical Provider, MD  urea (CARMOL) 20 % cream Apply 1 application topically 2 (two) times daily as needed (dry feet).    Historical Provider, MD   BP 133/116 mmHg  Pulse 83  Temp(Src) 98.2 F (36.8 C) (Oral)  Resp 16  Ht 5\' 11"  (1.803 m)  Wt 205 lb 7.5 oz (93.2 kg)  BMI 28.67 kg/m2  SpO2 95% Physical Exam  Constitutional: He appears well-developed and well-nourished. No distress.  HENT:  Head: Normocephalic and atraumatic.  Eyes: Conjunctivae are normal. Right eye exhibits no discharge. Left eye exhibits no discharge.  Neck: Neck supple.  Cardiovascular: Normal rate, regular rhythm and normal heart sounds.  Exam reveals no gallop and no friction rub.   No murmur heard. Pulmonary/Chest: Effort normal and breath sounds normal. No respiratory distress.  Abdominal: Soft. He exhibits no distension. There is no tenderness.  Musculoskeletal: He exhibits no edema or tenderness.  Neurological: He is alert.  Skin: Skin is warm and dry.  Psychiatric: He has a normal mood and affect. His behavior is normal. Thought content normal.  Nursing note and vitals reviewed.   ED Course  Procedures (including critical care time) Labs Review Labs Reviewed  CBC WITH DIFFERENTIAL - Abnormal; Notable for the following:    RBC 2.52 (*)    Hemoglobin 7.2 (*)    HCT 23.8 (*)    RDW 18.5 (*)    Platelets 86 (*)    All other components within normal limits  COMPREHENSIVE METABOLIC PANEL - Abnormal; Notable for the following:    Sodium 132 (*)    Chloride 92 (*)    Glucose, Bld 357 (*)    BUN 65 (*)    Creatinine, Ser 12.89 (*)    Calcium 7.6 (*)    Albumin 2.8 (*)    Alkaline Phosphatase 118 (*)    GFR calc non Af Amer 3 (*)    GFR calc Af Amer 4 (*)    Anion gap 20 (*)    All other components within normal limits  COMPREHENSIVE METABOLIC PANEL -  Abnormal; Notable for the following:    Sodium 134 (*)    Chloride 95 (*)    Glucose, Bld 205 (*)    BUN 66 (*)    Creatinine, Ser 13.27 (*)    Calcium 7.6 (*)    Albumin 2.6 (*)    Alkaline Phosphatase 128 (*)    GFR calc non Af Amer 3 (*)    GFR calc Af Amer 4 (*)    Anion gap 16 (*)    All other components within normal limits  CBC - Abnormal; Notable for the following:    RBC 3.02 (*)    Hemoglobin 8.6 (*)    HCT 27.3 (*)    RDW 18.6 (*)    Platelets 83 (*)    All other components within normal limits  HEMOGLOBIN A1C - Abnormal; Notable for the following:    Hgb A1c MFr Bld 6.0 (*)    Mean Plasma Glucose 126 (*)    All other components within normal limits  GLUCOSE, CAPILLARY - Abnormal; Notable for the following:    Glucose-Capillary 327 (*)    All other components within normal limits  GLUCOSE, CAPILLARY - Abnormal; Notable for the following:    Glucose-Capillary 131 (*)    All other components within normal limits  RETICULOCYTES - Abnormal; Notable for the following:    Retic Ct Pct 6.6 (*)    RBC. 3.21 (*)    Retic Count, Manual 211.9 (*)    All other components within normal limits  CBC - Abnormal; Notable for the following:    RBC 3.17 (*)    Hemoglobin 9.4 (*)    HCT 29.0 (*)    RDW 18.7 (*)    Platelets 108 (*)    All other components within normal limits  RENAL FUNCTION PANEL - Abnormal; Notable for the following:    Sodium 129 (*)    Potassium 5.2 (*)    Chloride 90 (*)    CO2 18 (*)    Glucose, Bld 132 (*)  BUN 69 (*)    Creatinine, Ser 13.69 (*)    Phosphorus 10.5 (*)    Albumin 2.9 (*)    GFR calc non Af Amer 3 (*)    GFR calc Af Amer 4 (*)    Anion gap 21 (*)    All other components within normal limits  GLUCOSE, CAPILLARY - Abnormal; Notable for the following:    Glucose-Capillary 137 (*)    All other components within normal limits  GLUCOSE, CAPILLARY - Abnormal; Notable for the following:    Glucose-Capillary 169 (*)    All other  components within normal limits  GLUCOSE, CAPILLARY - Abnormal; Notable for the following:    Glucose-Capillary 134 (*)    All other components within normal limits  GLUCOSE, CAPILLARY - Abnormal; Notable for the following:    Glucose-Capillary 145 (*)    All other components within normal limits  GLUCOSE, CAPILLARY - Abnormal; Notable for the following:    Glucose-Capillary 133 (*)    All other components within normal limits  GLUCOSE, CAPILLARY - Abnormal; Notable for the following:    Glucose-Capillary 125 (*)    All other components within normal limits  PROTIME-INR  VITAMIN B12  FOLATE  IRON AND TIBC  FERRITIN  I-STAT TROPOININ, ED  PREPARE RBC (CROSSMATCH)  TYPE AND SCREEN    Imaging Review No results found.   EKG Interpretation   Date/Time:  Friday October 22 2014 20:41:24 EST Ventricular Rate:  77 PR Interval:  249 QRS Duration: 168 QT Interval:  458 QTC Calculation: 518 R Axis:   -51 Text Interpretation:  Sinus rhythm Prolonged PR interval RBBB and LAFB ED  PHYSICIAN INTERPRETATION AVAILABLE IN [facility] Confirmed by TEST,  Record ([id number]) on 10/24/2014 9:06:34 AM      MDM   Final diagnoses:  SOB (shortness of breath)    69 year old male with fatigue/shortness of breath. He is transfused 1 unit of packed red blood cells yesterday for hemoglobin of 7.2. Despite this his hemoglobin remains at 7.2 and he does not feel appreciably better. Stool was apparently heme-positive yesterday. His continued have a lot of bowel movements but was given Kayexalate. He denies noting any overt bleeding and there is stool noted.    Virgel Manifold, MD 10/29/14 [phone]

## 2014-12-28 DEATH — deceased

## 2015-03-12 IMAGING — CR DG CHEST 1V PORT
1 series · 1 of 1 positions shown · non-contrast
Comparison: May 11, 2013

CLINICAL DATA: Hypertension; rectal carcinoma

EXAM:
PORTABLE CHEST - 1 VIEW

[AP]
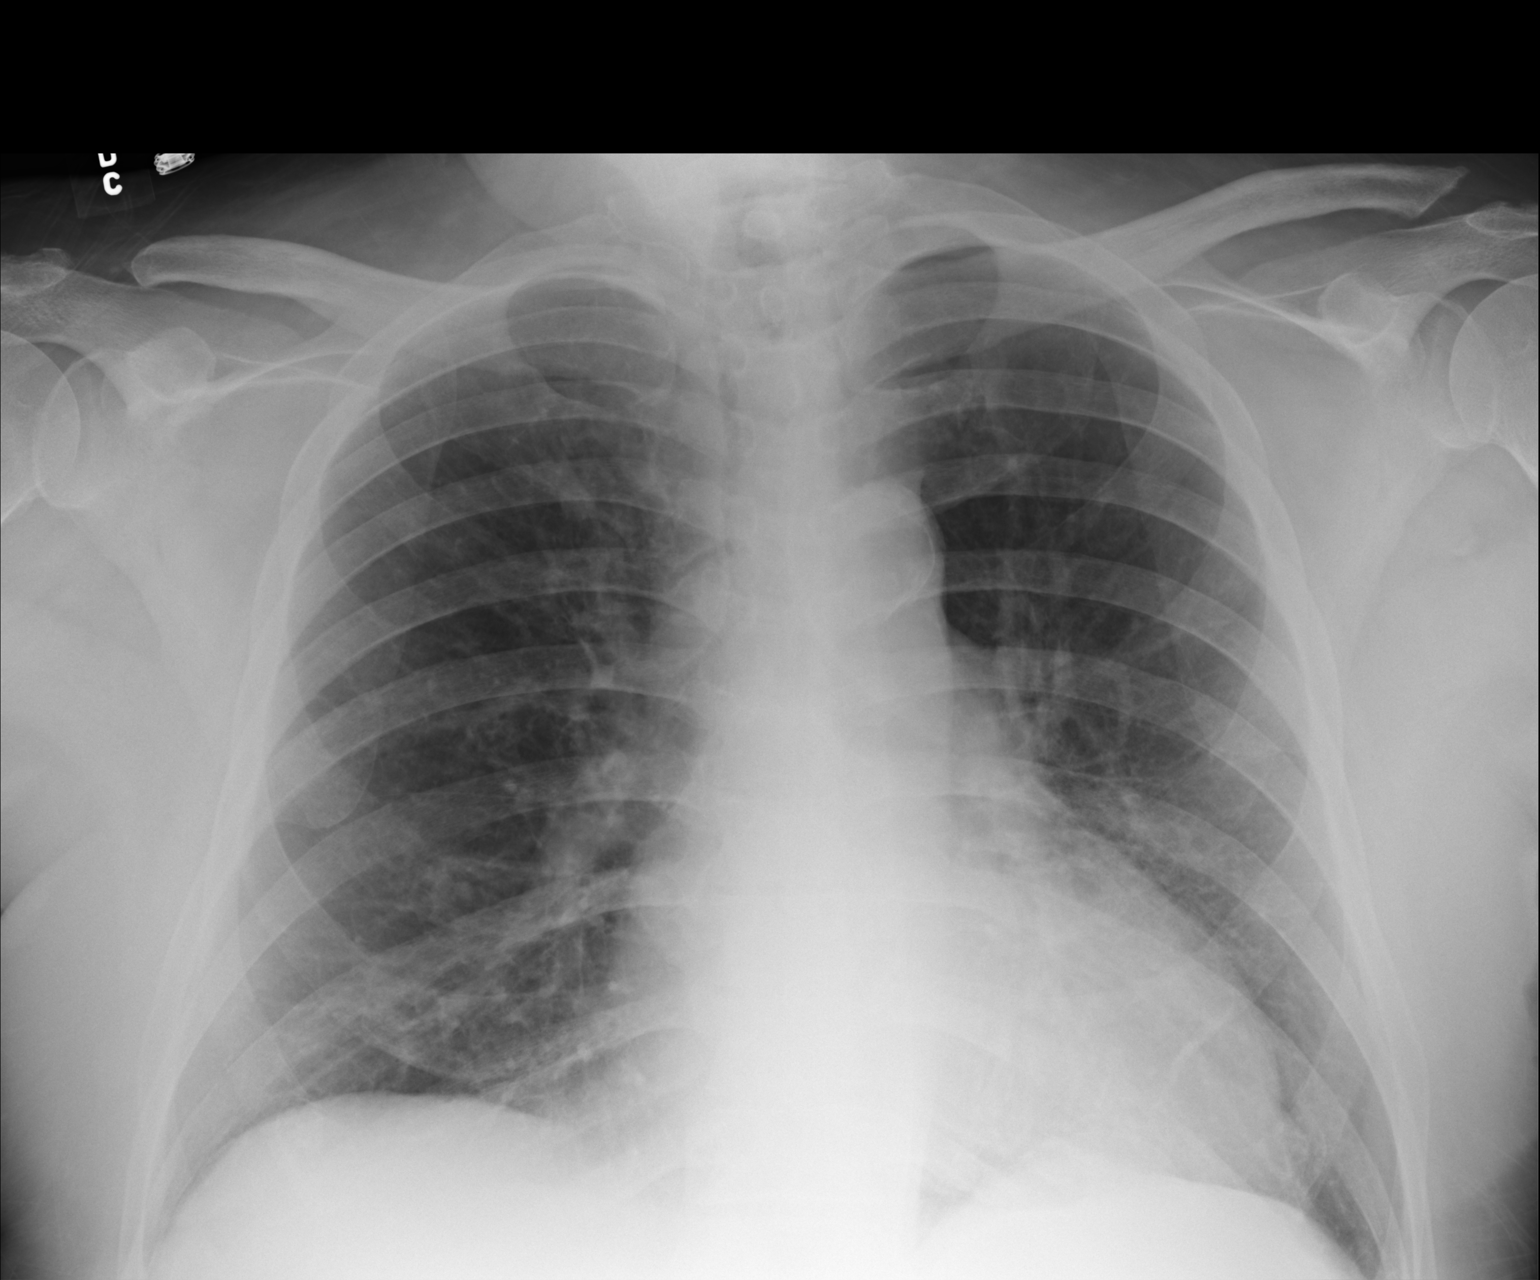

[1 of 1 positions shown; findings below may reference images not displayed]

FINDINGS: There is no edema or consolidation. Heart is upper normal in size
with pulmonary vascularity within normal limits. No adenopathy. No
bone lesions. There is atherosclerotic change in the aortic arch
region.
IMPRESSION: No edema or consolidation.

## 2015-05-08 IMAGING — CR DG CHEST 2V
2 series · 2 of 2 positions shown · non-contrast
Comparison: 08/26/2014

CLINICAL DATA: Chest pain and short of breath.

EXAM:
CHEST  2 VIEW

[chest pa]
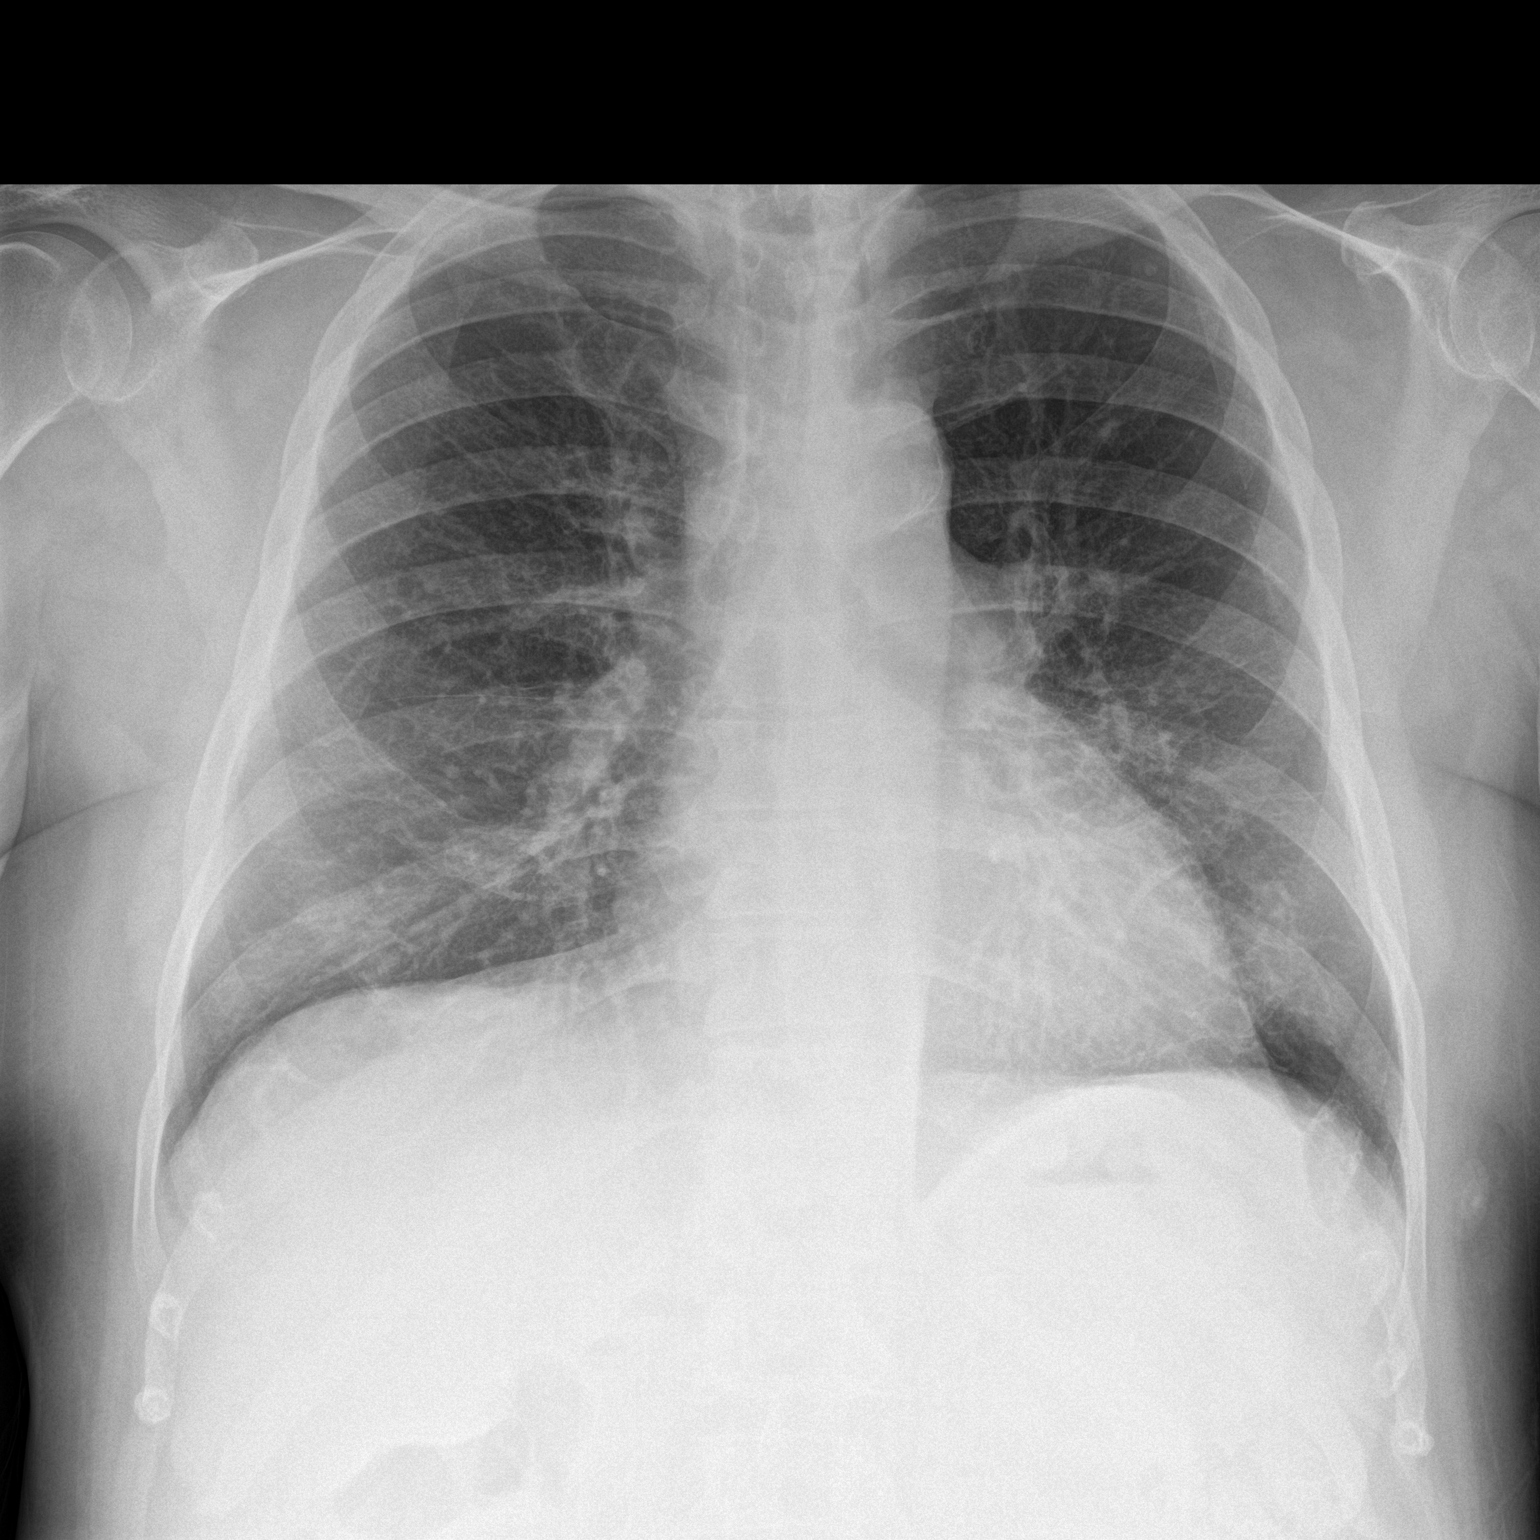

[chest lat]
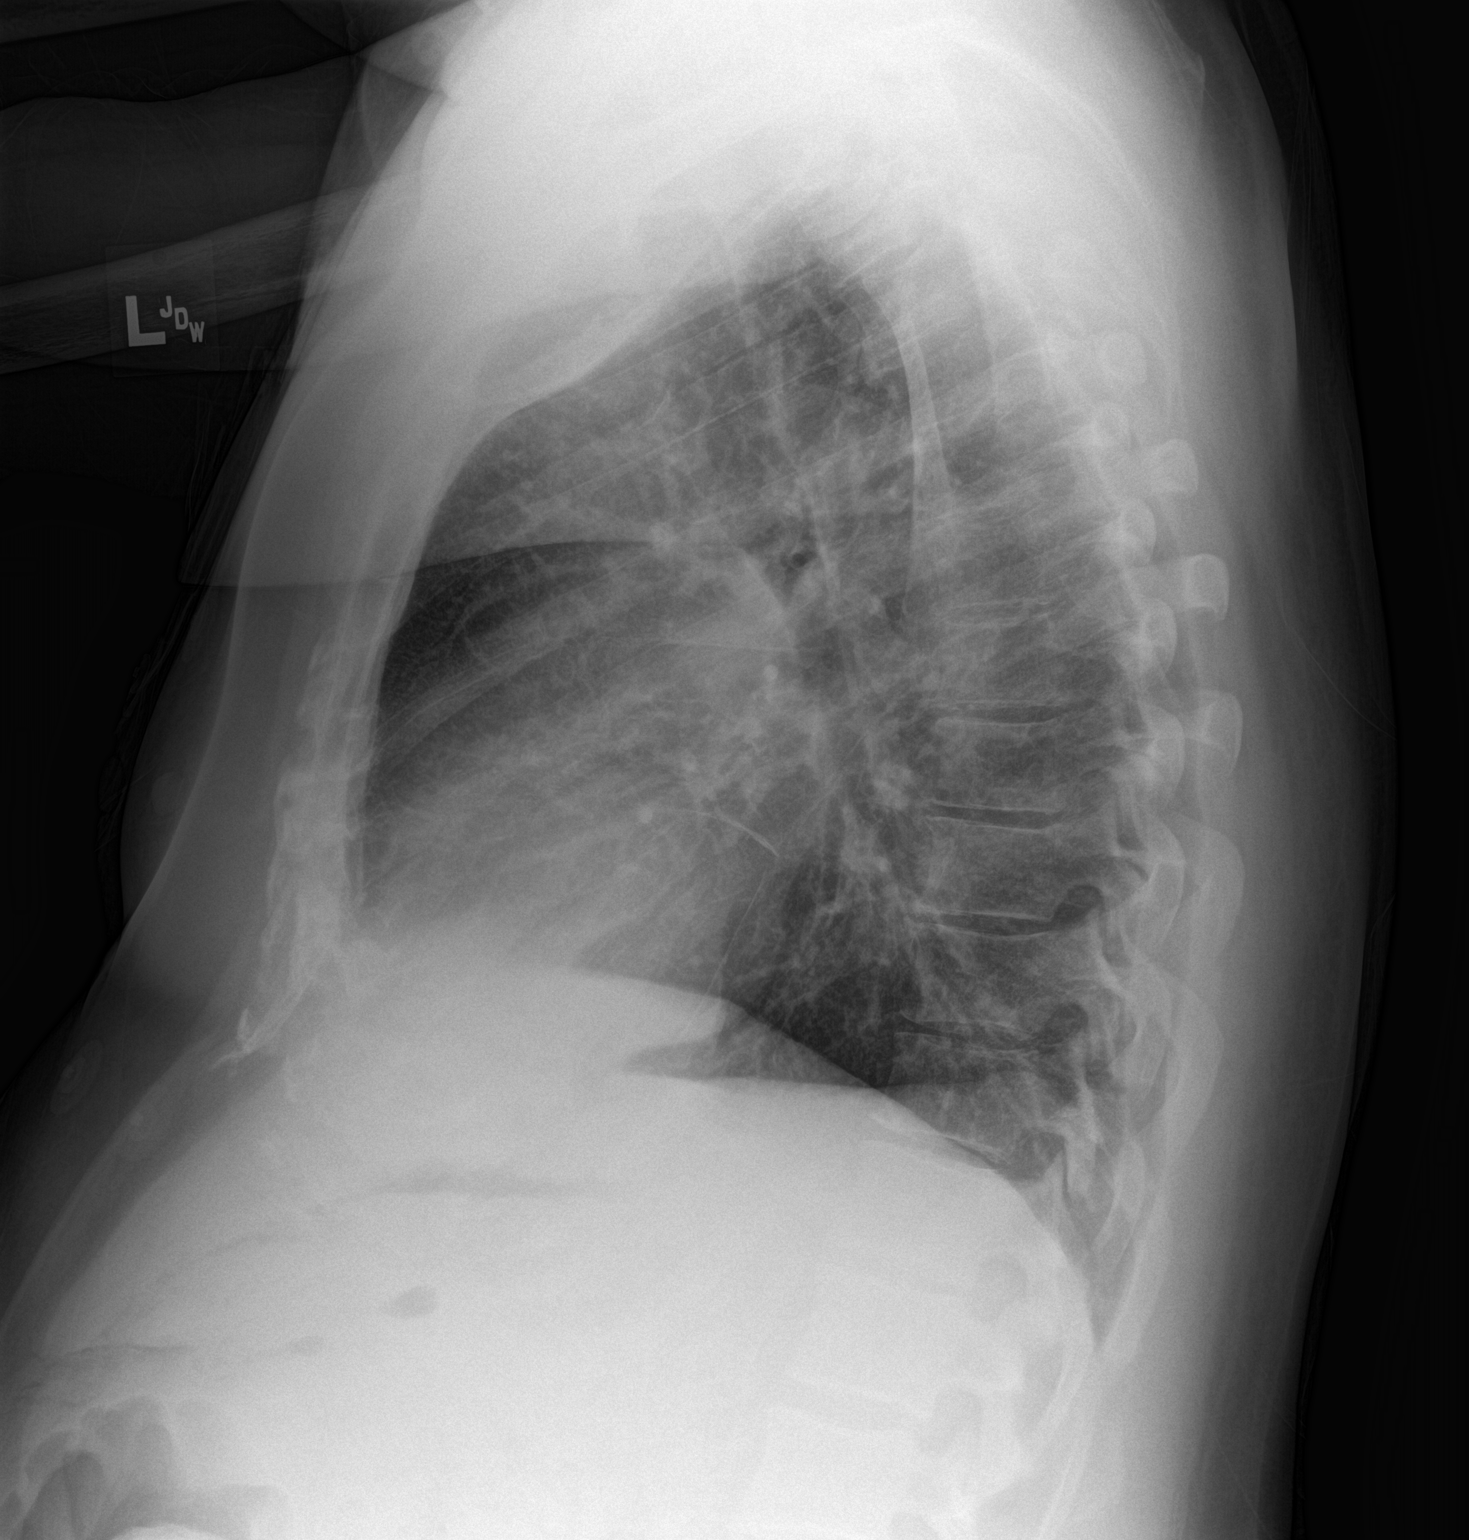

[2 of 2 positions shown; findings below may reference images not displayed]

FINDINGS: Normal heart size. Calcified granulomata in the left upper lobe.
Otherwise clear lungs. No pneumothorax. No pleural effusion. Stable
skeleton.
IMPRESSION: No active cardiopulmonary disease.
